# Patient Record
Sex: Female | Born: 1995 | Race: White | Hispanic: No | Marital: Single
Health system: Midwestern US, Community
[De-identification: ages and names within clinical notes are randomized; demographics above are authoritative.]

## PROBLEM LIST (undated history)

## (undated) DIAGNOSIS — N135 Crossing vessel and stricture of ureter without hydronephrosis: Secondary | ICD-10-CM

## (undated) DIAGNOSIS — Q6211 Congenital occlusion of ureteropelvic junction: Secondary | ICD-10-CM

## (undated) DIAGNOSIS — R1031 Right lower quadrant pain: Secondary | ICD-10-CM

## (undated) DIAGNOSIS — N946 Dysmenorrhea, unspecified: Secondary | ICD-10-CM

## (undated) DIAGNOSIS — G8918 Other acute postprocedural pain: Secondary | ICD-10-CM

## (undated) DIAGNOSIS — R109 Unspecified abdominal pain: Secondary | ICD-10-CM

## (undated) DIAGNOSIS — G8929 Other chronic pain: Secondary | ICD-10-CM

## (undated) DIAGNOSIS — R102 Pelvic and perineal pain: Secondary | ICD-10-CM

## (undated) DIAGNOSIS — N926 Irregular menstruation, unspecified: Secondary | ICD-10-CM

## (undated) DIAGNOSIS — M79603 Pain in arm, unspecified: Secondary | ICD-10-CM

---
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
## 2015-01-11 ENCOUNTER — Ambulatory Visit
Admit: 2015-01-11 | Discharge: 2015-01-11 | Payer: PRIVATE HEALTH INSURANCE | Attending: Urology | Primary: Family Medicine

## 2015-01-11 DIAGNOSIS — N135 Crossing vessel and stricture of ureter without hydronephrosis: Secondary | ICD-10-CM

## 2015-01-11 LAB — AMB POC URINALYSIS DIP STICK AUTO W/O MICRO
Bilirubin (UA POC): NEGATIVE
Blood (UA POC): NEGATIVE
Glucose (UA POC): NEGATIVE
Ketones (UA POC): NEGATIVE
Leukocyte esterase (UA POC): NEGATIVE
Nitrites (UA POC): NEGATIVE
Protein (UA POC): NEGATIVE mg/dL
Specific gravity (UA POC): 1.01 (ref 1.001–1.035)
Urobilinogen (UA POC): 0.2 (ref 0.2–1)
pH (UA POC): 7 (ref 4.6–8.0)

## 2015-01-11 NOTE — Progress Notes (Signed)
[name]  06/13/1995      ASSESSMENT:     ICD-10-CM ICD-9-CM    1. Ureteropelvic junction (UPJ) obstruction N13.5 593.4 AMB POC URINALYSIS DIP STICK AUTO W/O MICRO   2. Hydronephrosis of left kidney N13.30 591 AMB POC URINALYSIS DIP STICK AUTO W/O MICRO      1. Left UPJO by CT a/p with contrast 09/2014. F/u renal lasix scan demonstrates marked decrease uptake and increased accumulation of isotope with Lasix in left kidney. Poor excretion. Findings consistent with obstruction. T 1/2 69 min. Split renal function: left 46% and right 54%. Management options discussed, including indications, r/b. Patient desires robotic pyeloplasty as means of definitive repair.     Crossing lower pole vessel.    PLAN:    1. Reviewed CT a/p from renal lasix scan from 09/2014 with the patient and family, outlined below  2. Avoid intake of large fluid volume in 1 sitting   3. Plan for LEFT robotic pyeloplasty with antegrade stent and cysto to confirm stent position. Discussed indications, r/b with the patient. Patient indicated understanding and expressed desire to proceed. All questions answered.     DISCUSSION:  We discussed management options for UPJ obstruction: pyeloplasty vs endopyelotomy.  We discussed r/b of each including minimally invasive nature of endopyelotomy with faster recovery but better long term success rates with pyeloplasty.  The patient desires pyeloplasty with robotic approach.  We discussed r/b including infection, bleeding, blood transfusion, pain, anastamotic leak and or stricture, injury to kidney and/or surrounding organs, and global anesthesia and perioperative risks including CVA, MI, DVT, PE, pneumonia, and death.  The patient understands and desires to proceed.     Chief Complaint   Patient presents with   ??? Flank Pain   ??? UPJ Obstruction     HISTORY OF PRESENT ILLNESS: Kim [name] is a 19 y.o. female who presents today with her mother and father for the evaluation and management of left  UPJO. She has been referred to me by Dr. Janalyn [name]. The patient presented to the emergency department at [facility] on 10/05/14 with complaints of left flank pain. Pain characterized as an 8-9 on a scale of 1-10. No nausea associated. No f/c.  No UTIs.  No hematuria.  Pain presented after exercising. (She is in college at [employer] and plays softball.) She underwent further evaluation with CT at that time and was noted to have  dilatation of the left renal pelvis secondary to ureteropelvic junction obstruction. F/u renal lasix scan shows marked decrease uptake and increased accumulation of isotope with Lasix in left kidney. Poor excretion. Findings consistent with obstruction.     Past Medical History   Diagnosis Date   ??? Reactive airway disease        Past Surgical History   Procedure Laterality Date   ??? Hx tonsil and adenoidectomy         Social History   Substance Use Topics   ??? Smoking status: Never Smoker   ??? Smokeless tobacco: None   ??? Alcohol use No       No Known Allergies    No family history on file.    Current Outpatient Prescriptions   Medication Sig Dispense Refill   ??? naproxen (NAPROSYN) 250 mg tablet Take  by mouth two (2) times daily (with meals).     ??? HYDROCODONE/ACETAMINOPHEN (LORTAB 5-325 PO) Take  by mouth.         Review of Systems  Constitutional: Fever: No  Skin: Rash: No  HEENT: Hearing difficulty: No  Eyes: Blurred vision: No  Cardiovascular: Chest pain: No  Respiratory: Shortness of breath: No  Gastrointestinal: Nausea/vomiting: No  Musculoskeletal: Back pain: No  Neurological: Weakness: No  Psychological: Memory loss: No  Comments/additional findings:       PHYSICAL EXAMINATION:   Visit Vitals   ??? BP 118/72   ??? Ht 5' 10.5" (1.791 m)   ??? Wt 165 lb (74.8 kg)   ??? LMP 12/21/2014   ??? BMI 23.34 kg/m2     GEN: NAD, alert and oriented  PULM: nl resp effort, no distress  EXT: well perfused  PSYCH: Nl mood and affect    REVIEW OF LABS AND IMAGING:     Results for orders placed or performed in visit on 01/11/15   AMB POC URINALYSIS DIP STICK AUTO W/O MICRO   Result Value Ref Range    Color (UA POC) Yellow     Clarity (UA POC) Clear     Glucose (UA POC) Negative Negative    Bilirubin (UA POC) Negative Negative    Ketones (UA POC) Negative Negative    Specific gravity (UA POC) 1.010 1.001 - 1.035    Blood (UA POC) Negative Negative    pH (UA POC) 7.0 4.6 - 8.0    Protein (UA POC) Negative Negative mg/dL    Urobilinogen (UA POC) 0.2 mg/dL 0.2 - 1    Nitrites (UA POC) Negative Negative    Leukocyte esterase (UA POC) Negative Negative     NM RENAL SCAN FLOW/FUNC W PHARM SNGL 10/11/14 - report and imaging reviewed:  FINDINGS: 9 mCi technetium 99 in MAG3 were given intravenously. 20 mg of Lasix  were given intravenously 15 minutes.  ??  Imaging of the kidneys were obtained over 45 minutes.  ??  Aortic renal time: normal less than 3 seconds  left kidney >3 seconds  right kidney 2 seconds   ??  Transit time: Normal less than 5 minutes  Left kidney 26.5 minutes  Right kidney  ??  Excretory phase  T half max: Normal less than 10 minute  Left 96 minutes  Right 14.5 minutes  ??  T half post Lasix: Normal less than 15 minutes  Left 69.25 minutes   Right 3.25 minutes   ??  Percent excretion at 10 minutes: Normal greater than 50%  Left not applicable %  Right 14.6%  ??  Relative renal uptake: Normal 44-56%  Left 46%  Right 54%  ??  IMPRESSION:  1. Right kidney demonstrates mild delayed uptake and moderate decreased  excretion of isotope. Normal washout with Lasix. Findings consistent with  underlying medical renal disease. No obstruction.  2. Left kidney demonstrates marked decrease uptake and increased accumulation of  isotope with Lasix. Poor excretion. Findings consistent with obstruction.    CT Abdomen and Pelvis With Contrast 10/04/14 - report and imaging reviewed:  History: Lower pelvic pain for one week. Hematuria.   IMPRESSION: Chronic left UPJ obstruction. 3.1 cm right ovarian cyst. Free pelvic  fluid.  ??  Comment: CT images of the abdomen and pelvis were obtained following the  administration of oral and intravenous contrast.  ??  The lung bases are clear.  ??  The liver spleen pancreas adrenal glands and gallbladder are unremarkable.  ??  There is marked hydronephrosis and dilatation of the left renal pelvis secondary  to ureteropelvic junction obstruction. Otherwise the kidneys ureters and bladder  are normal.  ??  A 3 cm cyst resides in  the right adnexa. In addition there is free pelvic fluid  compatible with cyst rupture. Small follicular cysts are noted in the left  ovary. The uterus is unremarkable.  ??  No bowel obstruction, free intraperitoneal air or appendicitis.      Ruben Reason, MD   Assistant Professor   [facility] Dept of Urology   [facility] [place] [place]    CC: Benay Spice, MD    Medical Documentation is provided with the assistance of Frederich Cha, medical scribe for Ruben Reason, MD.

## 2015-01-13 ENCOUNTER — Encounter: Attending: Urology | Primary: Family Medicine

## 2015-01-27 ENCOUNTER — Encounter: Attending: Urology | Primary: Family Medicine

## 2015-02-23 ENCOUNTER — Inpatient Hospital Stay: Admit: 2015-02-23 | Payer: TRICARE (CHAMPUS) | Primary: Family Medicine

## 2015-02-23 DIAGNOSIS — Z01818 Encounter for other preprocedural examination: Secondary | ICD-10-CM

## 2015-02-23 LAB — URINALYSIS W/ RFLX MICROSCOPIC
Bilirubin: NEGATIVE
Glucose: NEGATIVE mg/dl
Ketone: NEGATIVE mg/dl
Leukocyte Esterase: NEGATIVE
Nitrites: NEGATIVE
Protein: NEGATIVE mg/dl
Specific gravity: 1.015 (ref 1.005–1.030)
Urobilinogen: 0.2 mg/dl (ref 0.0–1.0)
pH (UA): 6 (ref 5.0–9.0)

## 2015-02-23 LAB — CBC WITH AUTOMATED DIFF
BASOPHILS: 0.4 % (ref 0–3)
EOSINOPHILS: 2.4 % (ref 0–5)
HCT: 42.4 % (ref 37.0–50.0)
HGB: 14.8 gm/dl (ref 13.0–17.2)
IMMATURE GRANULOCYTES: 0.3 % (ref 0.0–3.0)
LYMPHOCYTES: 32.4 % (ref 28–48)
MCH: 29.8 pg (ref 25.4–34.6)
MCHC: 34.9 gm/dl (ref 30.0–36.0)
MCV: 85.5 fL (ref 80.0–98.0)
MONOCYTES: 5.9 % (ref 1–13)
MPV: 9.6 fL (ref 6.0–10.0)
NEUTROPHILS: 58.6 % (ref 34–64)
NRBC: 0 (ref 0–0)
PLATELET: 313 10*3/uL (ref 140–450)
RBC: 4.96 M/uL (ref 3.60–5.20)
RDW-SD: 39.3 (ref 36.4–46.3)
WBC: 7.5 10*3/uL (ref 4.0–11.0)

## 2015-02-23 LAB — POC URINE MICROSCOPIC

## 2015-02-23 LAB — METABOLIC PANEL, BASIC
BUN: 13 mg/dl (ref 7–25)
CO2: 29 mEq/L (ref 21–32)
Calcium: 8.8 mg/dl (ref 8.5–10.1)
Chloride: 105 mEq/L (ref 98–107)
Creatinine: 0.8 mg/dl (ref 0.6–1.3)
GFR est AA: >60
GFR est non-AA: >60
Glucose: 74 mg/dl (ref 74–106)
Potassium: 3.6 mEq/L (ref 3.5–5.1)
Sodium: 141 mEq/L (ref 136–145)

## 2015-02-23 LAB — PROTHROMBIN TIME + INR
INR: 1.1 (ref 0.1–1.1)
Prothrombin time: 13.8 seconds (ref 11.5–14.0)

## 2015-02-23 LAB — HCG URINE, QL: HCG urine, QL: NEGATIVE

## 2015-02-23 NOTE — Progress Notes (Signed)
Noted  Fwd to pt

## 2015-02-24 LAB — CULTURE, URINE
CULTURE RESULT: 10000 — AB
Culture result: 10000 — AB

## 2015-03-05 NOTE — Anesthesia Pre-Procedure Evaluation (Addendum)
Anesthetic History   No history of anesthetic complications            Review of Systems / Medical History  Patient summary reviewed, nursing notes reviewed and pertinent labs reviewed    Pulmonary  Within defined limits                 Neuro/Psych   Within defined limits           Cardiovascular  Within defined limits                     GI/Hepatic/Renal         Renal disease      Comments: upj obstruction Endo/Other  Within defined limits           Other Findings            Physical Exam    Airway  Mallampati: II  TM Distance: 4 - 6 cm  Neck ROM: normal range of motion   Mouth opening: Normal     Cardiovascular  Regular rate and rhythm,  S1 and S2 normal,  no murmur, click, rub, or gallop             Dental  No notable dental hx       Pulmonary  Breath sounds clear to auscultation               Abdominal  GI exam deferred       Other Findings            Anesthetic Plan    ASA: 2  Anesthesia type: general  GA ETT  Flank position  IV tylenol          Induction: Intravenous  Anesthetic plan and risks discussed with: Patient and Family      Discussed the anesthesiology plan and consent form including common and serious adverse events in detail.  QA to patient satisfaction.

## 2015-03-06 ENCOUNTER — Encounter: Primary: Family Medicine

## 2015-03-06 ENCOUNTER — Inpatient Hospital Stay
Admit: 2015-03-06 | Discharge: 2015-03-07 | Disposition: A | Discharge status: Home / Self Care | Payer: TRICARE (CHAMPUS) | Attending: Urology | Admitting: Urology

## 2015-03-06 ENCOUNTER — Ambulatory Visit: Admit: 2015-03-06 | Payer: TRICARE (CHAMPUS) | Primary: Family Medicine

## 2015-03-06 DIAGNOSIS — N135 Crossing vessel and stricture of ureter without hydronephrosis: Secondary | ICD-10-CM

## 2015-03-06 LAB — ANTIBODY SCREEN: Antibody screen: NEGATIVE

## 2015-03-06 LAB — BLOOD TYPE, (ABO+RH)
ABO/Rh(D): AB POS
ABO/Rh: AB POS

## 2015-03-06 LAB — POC HCG,URINE: HCG urine, QL: NEGATIVE

## 2015-03-06 MED ORDER — ACETAMINOPHEN (TYLENOL) SOLUTION 32MG/ML
ORAL | Status: DC | PRN
Start: 2015-03-06 — End: 2015-03-07

## 2015-03-06 MED ORDER — ONDANSETRON (PF) 4 MG/2 ML INJECTION
4 mg/2 mL | Freq: Once | INTRAMUSCULAR | Status: DC | PRN
Start: 2015-03-06 — End: 2015-03-06

## 2015-03-06 MED ORDER — FLU VACCINE QV 2016-17 (36 MOS+)(PF) 60 MCG (15 MCGX4)/0.5 ML IM SYRINGE
60 mcg (15 mcg x 4)/0.5 mL | INTRAMUSCULAR | Status: AC
Start: 2015-03-06 — End: 2015-03-07
  Administered 2015-03-07: 20:00:00 via INTRAMUSCULAR

## 2015-03-06 MED ORDER — ONDANSETRON (PF) 4 MG/2 ML INJECTION
4 mg/2 mL | INTRAMUSCULAR | Status: AC
Start: 2015-03-06 — End: ?

## 2015-03-06 MED ORDER — HYDROMORPHONE (PF) 2 MG/ML IJ SOLN
2 mg/mL | INTRAMUSCULAR | Status: AC
Start: 2015-03-06 — End: ?

## 2015-03-06 MED ORDER — HYDROMORPHONE (PF) 2 MG/ML IJ SOLN
2 mg/mL | INTRAMUSCULAR | Status: DC | PRN
Start: 2015-03-06 — End: 2015-03-06
  Administered 2015-03-06 (×3): via INTRAVENOUS

## 2015-03-06 MED ORDER — BACTERIOSTATIC SALINE 0.9 % IJ SOLN
0.9 % | INTRAMUSCULAR | Status: AC
Start: 2015-03-06 — End: ?

## 2015-03-06 MED ORDER — DIPHENHYDRAMINE HCL 50 MG/ML IJ SOLN
50 mg/mL | Freq: Four times a day (QID) | INTRAMUSCULAR | Status: DC | PRN
Start: 2015-03-06 — End: 2015-03-07

## 2015-03-06 MED ORDER — GUM MASTIC-STORAX-METHYLSALICYLATE-ALCOHOL IN A DROPPERETTE
Status: AC
Start: 2015-03-06 — End: ?

## 2015-03-06 MED ORDER — HYDROMORPHONE (PF) 1 MG/ML IJ SOLN
1 mg/mL | INTRAMUSCULAR | Status: DC | PRN
Start: 2015-03-06 — End: 2015-03-06
  Administered 2015-03-06 (×2): via INTRAVENOUS

## 2015-03-06 MED ORDER — PHENYLEPHRINE 10 MG/ML INJECTION
10 mg/mL | INTRAMUSCULAR | Status: AC
Start: 2015-03-06 — End: ?

## 2015-03-06 MED ORDER — LIDOCAINE 4 % TOPICAL SOLN
4 % (0 mg/mL) | Status: DC | PRN
Start: 2015-03-06 — End: 2015-03-06
  Administered 2015-03-06: 13:00:00 via NASAL

## 2015-03-06 MED ORDER — HYDROCODONE-ACETAMINOPHEN 5 MG-325 MG TAB
5-325 mg | Freq: Four times a day (QID) | ORAL | Status: DC | PRN
Start: 2015-03-06 — End: 2015-03-07
  Administered 2015-03-06 – 2015-03-07 (×2): via ORAL

## 2015-03-06 MED ORDER — THROMBIN (RECOMBINANT) 5,000 UNIT TOPICAL SOLUTION
5000 unit | CUTANEOUS | Status: AC
Start: 2015-03-06 — End: ?

## 2015-03-06 MED ORDER — SODIUM CHLORIDE 0.9 % IJ SYRG
INTRAMUSCULAR | Status: DC | PRN
Start: 2015-03-06 — End: 2015-03-06

## 2015-03-06 MED ORDER — LACTATED RINGERS IV
INTRAVENOUS | Status: DC
Start: 2015-03-06 — End: 2015-03-06
  Administered 2015-03-06: 11:00:00 via INTRAVENOUS

## 2015-03-06 MED ORDER — HEPARIN (PORCINE) 5,000 UNIT/ML IJ SOLN
5000 unit/mL | Freq: Two times a day (BID) | INTRAMUSCULAR | Status: DC
Start: 2015-03-06 — End: 2015-03-07
  Administered 2015-03-06 – 2015-03-07 (×2): via SUBCUTANEOUS

## 2015-03-06 MED ORDER — DOCUSATE SODIUM 100 MG CAP
100 mg | ORAL_CAPSULE | Freq: Three times a day (TID) | ORAL | 0 refills | Status: DC | PRN
Start: 2015-03-06 — End: 2015-03-31

## 2015-03-06 MED ORDER — KETOROLAC TROMETHAMINE 30 MG/ML INJECTION
30 mg/mL (1 mL) | INTRAMUSCULAR | Status: AC
Start: 2015-03-06 — End: ?

## 2015-03-06 MED ORDER — LIDOCAINE HCL 1 % (10 MG/ML) IJ SOLN
10 mg/mL (1 %) | Freq: Once | INTRAMUSCULAR | Status: AC | PRN
Start: 2015-03-06 — End: 2015-03-06
  Administered 2015-03-06: 11:00:00 via INTRADERMAL

## 2015-03-06 MED ORDER — ACETAMINOPHEN 325 MG TABLET
325 mg | ORAL | Status: DC | PRN
Start: 2015-03-06 — End: 2015-03-07

## 2015-03-06 MED ORDER — SODIUM CHLORIDE 0.9 % IV
INTRAVENOUS | Status: DC
Start: 2015-03-06 — End: 2015-03-07
  Administered 2015-03-06 – 2015-03-07 (×2): via INTRAVENOUS

## 2015-03-06 MED ORDER — CEFAZOLIN 2 GM/50 ML IN DEXTROSE (ISO-OSMOTIC) IVPB
2 gram/50 mL | Freq: Once | INTRAVENOUS | Status: AC
Start: 2015-03-06 — End: 2015-03-06
  Administered 2015-03-06: 13:00:00 via INTRAVENOUS

## 2015-03-06 MED ORDER — BUPIVACAINE (PF) 0.25 % (2.5 MG/ML) IJ SOLN
0.25 % (2.5 mg/mL) | INTRAMUSCULAR | Status: DC | PRN
Start: 2015-03-06 — End: 2015-03-06
  Administered 2015-03-06: 15:00:00 via SUBCUTANEOUS

## 2015-03-06 MED ORDER — OXYBUTYNIN CHLORIDE 5 MG TAB
5 mg | Freq: Four times a day (QID) | ORAL | Status: DC | PRN
Start: 2015-03-06 — End: 2015-03-07
  Administered 2015-03-07: 01:00:00 via ORAL

## 2015-03-06 MED ORDER — ONDANSETRON (PF) 4 MG/2 ML INJECTION
4 mg/2 mL | INTRAMUSCULAR | Status: DC | PRN
Start: 2015-03-06 — End: 2015-03-07

## 2015-03-06 MED ORDER — MIDAZOLAM 1 MG/ML IJ SOLN
1 mg/mL | INTRAMUSCULAR | Status: AC
Start: 2015-03-06 — End: ?

## 2015-03-06 MED ORDER — HEPARIN (PORCINE) 5,000 UNIT/ML IJ SOLN
5000 unit/mL | INTRAMUSCULAR | Status: AC
Start: 2015-03-06 — End: 2015-03-06
  Administered 2015-03-06: 12:00:00 via SUBCUTANEOUS

## 2015-03-06 MED ORDER — ROCURONIUM 10 MG/ML IV
10 mg/mL | INTRAVENOUS | Status: DC | PRN
Start: 2015-03-06 — End: 2015-03-06
  Administered 2015-03-06 (×2): via INTRAVENOUS

## 2015-03-06 MED ORDER — KETOROLAC TROMETHAMINE 30 MG/ML INJECTION
30 mg/mL (1 mL) | Freq: Four times a day (QID) | INTRAMUSCULAR | Status: DC
Start: 2015-03-06 — End: 2015-03-06
  Administered 2015-03-06: 20:00:00 via INTRAVENOUS

## 2015-03-06 MED ORDER — LACTATED RINGERS IV
INTRAVENOUS | Status: DC | PRN
Start: 2015-03-06 — End: 2015-03-06
  Administered 2015-03-06: 13:00:00 via INTRAVENOUS

## 2015-03-06 MED ORDER — LIDOCAINE HCL 1 % (10 MG/ML) IJ SOLN
10 mg/mL (1 %) | INTRAMUSCULAR | Status: AC
Start: 2015-03-06 — End: ?

## 2015-03-06 MED ORDER — VECURONIUM BROMIDE 10 MG IV SOLR
10 mg | INTRAVENOUS | Status: AC
Start: 2015-03-06 — End: ?

## 2015-03-06 MED ORDER — PROMETHAZINE IN NS 6.25 MG/50 ML IV PIGGY BAG
6.25 mg/50 ml | INTRAVENOUS | Status: DC | PRN
Start: 2015-03-06 — End: 2015-03-06

## 2015-03-06 MED ORDER — METHYLENE BLUE (ANTIDOTE) 1 % IJ SOLN
1 % (0 mg/mL) | INTRAVENOUS | Status: AC
Start: 2015-03-06 — End: ?

## 2015-03-06 MED ORDER — FENTANYL CITRATE (PF) 50 MCG/ML IJ SOLN
50 mcg/mL | INTRAMUSCULAR | Status: AC
Start: 2015-03-06 — End: ?

## 2015-03-06 MED ORDER — WATER FOR INJECTION, STERILE INJECTION
INTRAMUSCULAR | Status: AC
Start: 2015-03-06 — End: ?

## 2015-03-06 MED ORDER — GLYCOPYRROLATE 0.2 MG/ML IJ SOLN
0.2 mg/mL | INTRAMUSCULAR | Status: DC | PRN
Start: 2015-03-06 — End: 2015-03-06
  Administered 2015-03-06 (×3): via INTRAVENOUS

## 2015-03-06 MED ORDER — HYDROCODONE-ACETAMINOPHEN 5 MG-325 MG TAB
5-325 mg | ORAL_TABLET | ORAL | 0 refills | Status: DC | PRN
Start: 2015-03-06 — End: 2015-03-31

## 2015-03-06 MED ORDER — PROPOFOL 10 MG/ML IV EMUL
10 mg/mL | INTRAVENOUS | Status: AC
Start: 2015-03-06 — End: ?

## 2015-03-06 MED ORDER — HEPARIN (PORCINE) 1,000 UNIT/ML IJ SOLN
1000 unit/mL | INTRAMUSCULAR | Status: AC
Start: 2015-03-06 — End: ?

## 2015-03-06 MED ORDER — CEFAZOLIN 2 GM/50 ML IN DEXTROSE (ISO-OSMOTIC) IVPB
2 gram/50 mL | Freq: Three times a day (TID) | INTRAVENOUS | Status: DC
Start: 2015-03-06 — End: 2015-03-06
  Administered 2015-03-06: 16:00:00 via INTRAVENOUS

## 2015-03-06 MED ORDER — GLYCOPYRROLATE 0.2 MG/ML IJ SOLN
0.2 mg/mL | INTRAMUSCULAR | Status: AC
Start: 2015-03-06 — End: ?

## 2015-03-06 MED ORDER — DEXAMETHASONE SODIUM PHOSPHATE 4 MG/ML IJ SOLN
4 mg/mL | INTRAMUSCULAR | Status: AC
Start: 2015-03-06 — End: ?

## 2015-03-06 MED ORDER — NALOXONE 0.4 MG/ML INJECTION
0.4 mg/mL | INTRAMUSCULAR | Status: DC | PRN
Start: 2015-03-06 — End: 2015-03-07

## 2015-03-06 MED ORDER — CEFAZOLIN 2 GM/50 ML IN DEXTROSE (ISO-OSMOTIC) IVPB
2 gram/50 mL | INTRAVENOUS | Status: DC
Start: 2015-03-06 — End: 2015-03-06

## 2015-03-06 MED ORDER — BUPIVACAINE-EPINEPHRINE (PF) 0.25 %-1:200,000 IJ SOLN
0.25 %-1:200,000 | INTRAMUSCULAR | Status: AC
Start: 2015-03-06 — End: ?

## 2015-03-06 MED ORDER — NEOSTIGMINE METHYLSULFATE 1 MG/ML INJECTION
1 mg/mL | INTRAMUSCULAR | Status: AC
Start: 2015-03-06 — End: ?

## 2015-03-06 MED ORDER — ACETAMINOPHEN 650 MG RECTAL SUPPOSITORY
650 mg | RECTAL | Status: DC | PRN
Start: 2015-03-06 — End: 2015-03-07

## 2015-03-06 MED ORDER — ACETAMINOPHEN 1,000 MG/100 ML (10 MG/ML) IV
1000 mg/100 mL (10 mg/mL) | INTRAVENOUS | Status: AC
Start: 2015-03-06 — End: ?

## 2015-03-06 MED ORDER — ONDANSETRON (PF) 4 MG/2 ML INJECTION
4 mg/2 mL | INTRAMUSCULAR | Status: DC | PRN
Start: 2015-03-06 — End: 2015-03-06
  Administered 2015-03-06: 12:00:00 via INTRAVENOUS

## 2015-03-06 MED ORDER — DEXAMETHASONE SODIUM PHOSPHATE 4 MG/ML IJ SOLN
4 mg/mL | INTRAMUSCULAR | Status: DC | PRN
Start: 2015-03-06 — End: 2015-03-06
  Administered 2015-03-06: 13:00:00 via INTRAVENOUS

## 2015-03-06 MED ORDER — SUCCINYLCHOLINE CHLORIDE 20 MG/ML INJECTION
20 mg/mL | INTRAMUSCULAR | Status: AC
Start: 2015-03-06 — End: ?

## 2015-03-06 MED ORDER — MORPHINE 2 MG/ML INJECTION
2 mg/mL | INTRAMUSCULAR | Status: DC | PRN
Start: 2015-03-06 — End: 2015-03-07
  Administered 2015-03-06 – 2015-03-07 (×8): via INTRAVENOUS

## 2015-03-06 MED ORDER — DOCUSATE SODIUM 100 MG CAP
100 mg | Freq: Two times a day (BID) | ORAL | Status: DC
Start: 2015-03-06 — End: 2015-03-07
  Administered 2015-03-07 (×2): via ORAL

## 2015-03-06 MED ORDER — KETOROLAC TROMETHAMINE 30 MG/ML INJECTION
30 mg/mL (1 mL) | INTRAMUSCULAR | Status: DC | PRN
Start: 2015-03-06 — End: 2015-03-06
  Administered 2015-03-06: 16:00:00 via INTRAVENOUS

## 2015-03-06 MED ORDER — OXYBUTYNIN CHLORIDE SR 5 MG 24 HR TAB
5 mg | ORAL_TABLET | Freq: Every day | ORAL | 0 refills | Status: DC
Start: 2015-03-06 — End: 2015-05-26

## 2015-03-06 MED ORDER — MIDAZOLAM 1 MG/ML IJ SOLN
1 mg/mL | INTRAMUSCULAR | Status: DC | PRN
Start: 2015-03-06 — End: 2015-03-06
  Administered 2015-03-06: 13:00:00 via INTRAVENOUS

## 2015-03-06 MED ORDER — CEFAZOLIN 2 GM/50 ML IN DEXTROSE (ISO-OSMOTIC) IVPB
2 gram/50 mL | Freq: Three times a day (TID) | INTRAVENOUS | Status: AC
Start: 2015-03-06 — End: 2015-03-07
  Administered 2015-03-06 – 2015-03-07 (×2): via INTRAVENOUS

## 2015-03-06 MED ORDER — LIDOCAINE (PF) 20 MG/ML (2 %) IJ SOLN
20 mg/mL (2 %) | INTRAMUSCULAR | Status: DC | PRN
Start: 2015-03-06 — End: 2015-03-06
  Administered 2015-03-06: 13:00:00 via INTRAVENOUS

## 2015-03-06 MED ORDER — EPHEDRINE SULFATE 50 MG/ML IJ SOLN
50 mg/mL | INTRAMUSCULAR | Status: AC
Start: 2015-03-06 — End: ?

## 2015-03-06 MED ORDER — THROMBIN (RECOMBINANT) 5,000 UNIT TOPICAL SOLUTION
5000 unit | CUTANEOUS | Status: DC | PRN
Start: 2015-03-06 — End: 2015-03-06
  Administered 2015-03-06: 15:00:00 via TOPICAL

## 2015-03-06 MED ORDER — BUPIVACAINE LIPOSOME (PF) 266 MG/20 ML (13.3 MG/ML) SUSP, INFILTRATION
1.3 % (13.3 mg/mL) | Status: AC
Start: 2015-03-06 — End: ?

## 2015-03-06 MED ORDER — BUPIVACAINE (PF) 0.25 % (2.5 MG/ML) IJ SOLN
0.25 % (2.5 mg/mL) | INTRAMUSCULAR | Status: AC
Start: 2015-03-06 — End: ?

## 2015-03-06 MED ORDER — VECURONIUM BROMIDE 10 MG IV SOLR
10 mg | INTRAVENOUS | Status: DC | PRN
Start: 2015-03-06 — End: 2015-03-06
  Administered 2015-03-06 (×4): via INTRAVENOUS

## 2015-03-06 MED ORDER — PHENOL 1.4 % MUCOSAL AEROSOL SPRAY
1.4 % | Status: DC | PRN
Start: 2015-03-06 — End: 2015-03-07

## 2015-03-06 MED ORDER — FENTANYL CITRATE (PF) 50 MCG/ML IJ SOLN
50 mcg/mL | INTRAMUSCULAR | Status: DC | PRN
Start: 2015-03-06 — End: 2015-03-06
  Administered 2015-03-06 (×6): via INTRAVENOUS

## 2015-03-06 MED ORDER — ROCURONIUM 10 MG/ML IV
10 mg/mL | INTRAVENOUS | Status: AC
Start: 2015-03-06 — End: ?

## 2015-03-06 MED ORDER — FENTANYL CITRATE (PF) 50 MCG/ML IJ SOLN
50 mcg/mL | INTRAMUSCULAR | Status: DC | PRN
Start: 2015-03-06 — End: 2015-03-06
  Administered 2015-03-06 (×2): via INTRAVENOUS

## 2015-03-06 MED ORDER — HEPARIN (PORCINE) 5,000 UNIT/ML IJ SOLN
5000 unit/mL | INTRAMUSCULAR | Status: DC
Start: 2015-03-06 — End: 2015-03-06

## 2015-03-06 MED ORDER — PROPOFOL 10 MG/ML IV EMUL
10 mg/mL | INTRAVENOUS | Status: DC | PRN
Start: 2015-03-06 — End: 2015-03-06
  Administered 2015-03-06: 13:00:00 via INTRAVENOUS

## 2015-03-06 MED ORDER — ACETAMINOPHEN 1,000 MG/100 ML (10 MG/ML) IV
1000 mg/100 mL (10 mg/mL) | INTRAVENOUS | Status: DC | PRN
Start: 2015-03-06 — End: 2015-03-06
  Administered 2015-03-06: 13:00:00 via INTRAVENOUS

## 2015-03-06 MED ORDER — NEOSTIGMINE METHYLSULFATE 1 MG/ML INJECTION
1 mg/mL | INTRAMUSCULAR | Status: DC | PRN
Start: 2015-03-06 — End: 2015-03-06
  Administered 2015-03-06 (×2): via INTRAVENOUS

## 2015-03-06 MED FILL — MARCAINE-EPINEPHRINE (PF) 0.25 %-1:200,000 INJECTION SOLUTION: 0.25 %-1:200,000 | INTRAMUSCULAR | Qty: 30

## 2015-03-06 MED FILL — LACTATED RINGERS IV: INTRAVENOUS | Qty: 1000

## 2015-03-06 MED FILL — FENTANYL CITRATE (PF) 50 MCG/ML IJ SOLN: 50 mcg/mL | INTRAMUSCULAR | Qty: 200

## 2015-03-06 MED FILL — MORPHINE 2 MG/ML INJECTION: 2 mg/mL | INTRAMUSCULAR | Qty: 1

## 2015-03-06 MED FILL — ONDANSETRON (PF) 4 MG/2 ML INJECTION: 4 mg/2 mL | INTRAMUSCULAR | Qty: 2

## 2015-03-06 MED FILL — HEPARIN (PORCINE) 5,000 UNIT/ML IJ SOLN: 5000 unit/mL | INTRAMUSCULAR | Qty: 1

## 2015-03-06 MED FILL — LIDOCAINE HCL 1 % (10 MG/ML) IJ SOLN: 10 mg/mL (1 %) | INTRAMUSCULAR | Qty: 20

## 2015-03-06 MED FILL — KETOROLAC TROMETHAMINE 30 MG/ML INJECTION: 30 mg/mL (1 mL) | INTRAMUSCULAR | Qty: 1

## 2015-03-06 MED FILL — METHYLENE BLUE (ANTIDOTE) 1 % IJ SOLN: 1 % (0 mg/mL) | INTRAVENOUS | Qty: 10

## 2015-03-06 MED FILL — EXPAREL (PF) 1.3 % (13.3 MG/ML) SUSPENSION FOR LOCAL INFILTRATION: 1.3 % (13.3 mg/mL) | Qty: 20

## 2015-03-06 MED FILL — ROCURONIUM 10 MG/ML IV: 10 mg/mL | INTRAVENOUS | Qty: 5

## 2015-03-06 MED FILL — VECURONIUM BROMIDE 10 MG IV SOLR: 10 mg | INTRAVENOUS | Qty: 5

## 2015-03-06 MED FILL — WATER FOR INJECTION, STERILE INJECTION: INTRAMUSCULAR | Qty: 10

## 2015-03-06 MED FILL — LIDOCAINE 4 % TOPICAL SOLN: 4 % (0 mg/mL) | Qty: 4

## 2015-03-06 MED FILL — XYLOCAINE-MPF 20 MG/ML (2 %) INJECTION SOLUTION: 20 mg/mL (2 %) | INTRAMUSCULAR | Qty: 60

## 2015-03-06 MED FILL — RECOTHROM 5,000 UNIT TOPICAL SOLUTION: 5000 unit | CUTANEOUS | Qty: 2

## 2015-03-06 MED FILL — HYDROMORPHONE (PF) 2 MG/ML IJ SOLN: 2 mg/mL | INTRAMUSCULAR | Qty: 1.6

## 2015-03-06 MED FILL — HYDROMORPHONE (PF) 2 MG/ML IJ SOLN: 2 mg/mL | INTRAMUSCULAR | Qty: 1

## 2015-03-06 MED FILL — DEXAMETHASONE SODIUM PHOSPHATE 4 MG/ML IJ SOLN: 4 mg/mL | INTRAMUSCULAR | Qty: 10

## 2015-03-06 MED FILL — HEPARIN (PORCINE) 1,000 UNIT/ML IJ SOLN: 1000 unit/mL | INTRAMUSCULAR | Qty: 10

## 2015-03-06 MED FILL — DIPRIVAN 10 MG/ML INTRAVENOUS EMULSION: 10 mg/mL | INTRAVENOUS | Qty: 20

## 2015-03-06 MED FILL — ROCURONIUM 10 MG/ML IV: 10 mg/mL | INTRAVENOUS | Qty: 50

## 2015-03-06 MED FILL — QUELICIN 20 MG/ML INJECTION SOLUTION: 20 mg/mL | INTRAMUSCULAR | Qty: 10

## 2015-03-06 MED FILL — NEOSTIGMINE METHYLSULFATE 1 MG/ML INJECTION: 1 mg/mL | INTRAMUSCULAR | Qty: 10

## 2015-03-06 MED FILL — CEFAZOLIN 2 GM/50 ML IN DEXTROSE (ISO-OSMOTIC) IVPB: 2 gram/50 mL | INTRAVENOUS | Qty: 50

## 2015-03-06 MED FILL — MIDAZOLAM 1 MG/ML IJ SOLN: 1 mg/mL | INTRAMUSCULAR | Qty: 2

## 2015-03-06 MED FILL — SODIUM CHLORIDE 0.9 % IV: INTRAVENOUS | Qty: 1000

## 2015-03-06 MED FILL — PHENYLEPHRINE 10 MG/ML INJECTION: 10 mg/mL | INTRAMUSCULAR | Qty: 1

## 2015-03-06 MED FILL — ACEPHEN 650 MG RECTAL SUPPOSITORY: 650 mg | RECTAL | Qty: 1

## 2015-03-06 MED FILL — HYDROCODONE-ACETAMINOPHEN 5 MG-325 MG TAB: 5-325 mg | ORAL | Qty: 1

## 2015-03-06 MED FILL — BUPIVACAINE (PF) 0.25 % (2.5 MG/ML) IJ SOLN: 0.25 % (2.5 mg/mL) | INTRAMUSCULAR | Qty: 30

## 2015-03-06 MED FILL — GLYCOPYRROLATE 0.2 MG/ML IJ SOLN: 0.2 mg/mL | INTRAMUSCULAR | Qty: 3

## 2015-03-06 MED FILL — BACTERIOSTATIC SALINE 0.9 % IJ SOLN: 0.9 % | INTRAMUSCULAR | Qty: 30

## 2015-03-06 MED FILL — VECURONIUM BROMIDE 10 MG IV SOLR: 10 mg | INTRAVENOUS | Qty: 10

## 2015-03-06 MED FILL — NEOSTIGMINE METHYLSULFATE 1 MG/ML INJECTION: 1 mg/mL | INTRAMUSCULAR | Qty: 4

## 2015-03-06 MED FILL — DEXAMETHASONE SODIUM PHOSPHATE 4 MG/ML IJ SOLN: 4 mg/mL | INTRAMUSCULAR | Qty: 5

## 2015-03-06 MED FILL — EPHEDRINE SULFATE 50 MG/ML IJ SOLN: 50 mg/mL | INTRAMUSCULAR | Qty: 1

## 2015-03-06 MED FILL — OFIRMEV 1,000 MG/100 ML (10 MG/ML) INTRAVENOUS SOLUTION: 1000 mg/100 mL (10 mg/mL) | INTRAVENOUS | Qty: 100

## 2015-03-06 MED FILL — FENTANYL CITRATE (PF) 50 MCG/ML IJ SOLN: 50 mcg/mL | INTRAMUSCULAR | Qty: 2

## 2015-03-06 MED FILL — GLYCOPYRROLATE 0.2 MG/ML IJ SOLN: 0.2 mg/mL | INTRAMUSCULAR | Qty: 0.9

## 2015-03-06 MED FILL — DIPRIVAN 10 MG/ML INTRAVENOUS EMULSION: 10 mg/mL | INTRAVENOUS | Qty: 200

## 2015-03-06 MED FILL — GUM MASTIC-STORAX-METHYLSALICYLATE-ALCOHOL IN A DROPPERETTE: Qty: 1

## 2015-03-06 MED FILL — CHLORASEPTIC THROAT SPRAY 1.4 % AEROSOL: 1.4 % | Qty: 20

## 2015-03-06 MED FILL — HYDROMORPHONE (PF) 1 MG/ML IJ SOLN: 1 mg/mL | INTRAMUSCULAR | Qty: 1

## 2015-03-06 MED FILL — GLYCOPYRROLATE 0.2 MG/ML IJ SOLN: 0.2 mg/mL | INTRAMUSCULAR | Qty: 1

## 2015-03-06 NOTE — Op Note (Signed)
[facility]  Inpatient Operation Report  NAME:  Kim [name], Kim [name]  SEX:   F  DATE: 03/06/2015  DOB: [birth year]  MR#    [id number]  ROOM:  4207  ACCT#  [id number]        PREOPERATIVE DIAGNOSIS:  Left ureteropelvic junction obstruction.    POSTOPERATIVE DIAGNOSIS:  Left ureteropelvic junction obstruction.    PROCEDURE:  Robotic-assisted laparoscopic left dismembered pyeloplasty with antegrade   stent placement and cystoscopy.    SURGEON:  Dr. Judie Petit    ASSISTANT:  Dr. Bonney Roussel (Fellow)    RESIDENT:  Dr. Einar Grad.    SURGICAL ASSISTANT:   R. Keeling.      ANESTHESIA:  General.    FINDINGS:  Left ureteropelvic junction obstruction with anterior crossing vessel;   dismembered pyeloplasty was performed with transposition of the lower pole   vessel, stent was placed.  Cystoscopy confirmed good stent position.    SPECIMENS:  Left ureteropelvic junction.    BLOOD LOSS:  Less  than 25 mL    DRAINS:  Left 6-French x 26 cm double-J stent and 18-French Foley catheter also   15-French round JP.    COMPLICATIONS:  None.    HISTORY:  A 19 year old female who presents for pyeloplasty for left ureteropelvic   junction obstruction.    DESCRIPTION OF PROCEDURE:  Informed consent was obtained.  The patient was marked on the left side.  She   was given subcutaneous heparin for deep vein thrombosis prophylaxis and IV   Ancef for bacterial prophylaxis.  Her urine pregnancy test was confirmed   negative.  She was taken to the operating room.  General anesthesia was   provided.  A Foley catheter was placed to drain the bladder.  She was   positioned right lateral decubitus with the left flank elevated about 70   degrees, an axillary roll was placed.  Left arm was placed in the padded   support and all pressure points were meticulously padded.  The table was   flexed slightly and she was secured to the table with soft straps of the torso   and lower extremities.  She was prepped and draped sterilely.  We had a   time-out  confirming the patient identification, planned procedure, surgical   site and all present were in agreement.  A Veress needle was placed lateral to   the left rectus belly at the level of the umbilicus.  Aspiration, irrigation   and saline drop test confirmed good positioning.  Pneumoperitoneum was   initiated with an opening pressure of 7 mmHg.  The abdomen was insufflated to   15 mmHg.  Marked four trocar sites which included 2 Robotic arms 1.5 cm apart   in the mid clavicular line, a 12 mm trocar between for the camera and the   midline and another 12 mm trocar in the umbilicus for the assistant.  The 5 mm   trocar was placed at one of the robotic trocar sites using a visual   obturator.  The abdomen was surveyed.  There were no abnormalities or   injuries, other than dilated renal pelvis which was easily apparent.  All the   remaining trocars were placed under laparoscopic vision.  At the two 12 mm   trocar sites, placed a 0 Vicryl with Carter-Thomason device for closure of the   fascia at the end of the case.  Once all trocars were in position, the robot   was docked.  The white line  of Toldt was incised and the left colon was   reflected, we could see a very dilated renal pelvis.  The gonadal vein was   identified and medialized, I then identified the ureter and traced it up to   the UPJ, taking care not to skeletonize the ureter.  I cleaned off the   anterior surface of the ureter and renal pelvis at the UPJ and I noted lower   pole crossing vessels anterior to the UPJ severe hydronephrosis was noted.    The lower pole vessels were mobilized off of the UPJ and the UPJ was   circumferentially exposed.  I divided the ureter just below the UPJ and then   spatulated the proximal aspect of the ureter for about 1.5 cm.  The UPJ was   then excised and sent for pathology.  The renal pelvis was nicely decompressed   at this point.  The apex of the spatulated ureter was secured to the   dependent portion of the renal  pelvis with a couple of interrupted 4-0   Monocryl sutures in a tension free fashion.  This of course was done  anterior   to the lower pole crossing vessels.  The posterior anastomotic plate was   closed with a running 4-0 Monocryl suture.  Next a  Glidewire was advanced down the ureter and a 6 x 26 double-J stent was   advanced over the wire.  The wire was removed and the stent position looked   good.  The proximal curl was tucked into the renal pelvis.  The renal pelvis   was copiously irrigated and there was good hemostasis in the renal pelvis.    The anterior anastomosis was completed with another running 4-0 Monocryl   suture and the 2 suture lines were then tied down securely at the apex.  The   anastomosis was inspected circumferentially and it was watertight, widely   spatulated and tension free.  At this point, we reduced the pneumoperitoneal   pressure down to 5 mmHg and hemostasis was confirmed throughout the surgical   field.  We put some FloSeal around the UPJ.  All the needles and sutures were   removed and our sponge, needle and instrument count was correct at this time.    A 15 French round JP was positioned over the kidney.  The robot was then   undocked.  The trocars were removed.  The drain was secured to the skin with a   nylon suture.  The 0 Vicryls were tied down securely to close the fascia at   the midline 12 mm trocar sites.  All the wounds were irrigated and then   infiltrated with 0.25% Marcaine plain.  The skin edges were reapproximated   with 4-0 Monocryl in subcuticular fashion.  Dermabond was applied.  Drain   dressing was applied.  The patient was then positioned frog legged and the   Foley catheter was removed.  The perineum was prepped and a flexible   cystoscope was introduced into the bladder.  We confirmed that the distal   stent curl was in good position.  The bladder otherwise appeared normal.  The   scope was removed.  An 18-French Foley catheter was placed to drain the    bladder.  The patient was then transferred to stretcher.  She was awakened   from anesthesia and transferred to recovery in stable condition.  There were   no complications and the patient tolerated the procedure well.  Operative   events were discussed with the patient's family members.    DISPOSITION:  She will be admitted for postoperative care.  We will do a void trial tomorrow   morning.  A JP creatinine a few hours later and hopefully get the JP out   tomorrow and discharged home in the afternoon.  She will follow up with me in   the office in 3 to 4 weeks for cystoscopy and stent removal in the office.      ___________________  Ruben ReasonJohn B Hibba Schram M.D.  Dictated By:.   FS  D:03/06/2015 11:06:12  T: 03/06/2015 12:37:43  [id number]

## 2015-03-06 NOTE — Other (Signed)
Family at bed side.

## 2015-03-06 NOTE — Brief Op Note (Signed)
BRIEF OPERATIVE NOTE    Date of Procedure: 03/06/2015   Preoperative Diagnosis: URETEROPLEVIC JUNCTION OBSTRUCTION; (N13.5)  Postoperative Diagnosis: URETEROPLEVIC JUNCTION OBSTRUCTION; (N13.5)    Procedure(s):  DAVINCI SI LEFT PYELOPLASTY; ANTEGRADE STENT & CYSTOSCOPY  Surgeon(s) and Role:     * Ruben ReasonJohn B Nery Frappier, MD - Primary            Surgical Staff:  Circ-1: Maryjo Rochesteramita R Owens, RN  Circ-Relief: London SheerWinifreda I Diaz, RN  Scrub Tech-1: Hope Jamse MeadMelissa Bradley  Surg Asst-1: Cherie DarkRojas R Keeling  Event Time In   Incision Start 0815   Incision Close      Anesthesia: General   Estimated Blood Loss: <25cc  Specimens:   ID Type Source Tests Collected by Time Destination   1 : LEFT URETEROPELVIC JUNCTION Tissue Ureter AP HISTOLOGY Ruben ReasonJohn B Leandrew Keech, MD 03/06/2015 [phone]       Findings: L UPJO with crossing vessel.  Cysto confirmed good stent position.  Complications: none  Implants:   Implant Name Type Inv. Item Serial No. Manufacturer Lot No. LRB No. Used Action   STENT URETERAL SOFT [id number] - [id number]   STENT URETERAL SOFT [id number]   [employer] VPI [id number] Left 1 Implanted       Ruben ReasonJohn B. Emiline Mancebo, MD  Assistant Professor  [facility]  [facility] [place]

## 2015-03-06 NOTE — Other (Signed)
----------  DocumentID:   [id number]------------------------------------------------              [facility]                       Patient Education Report         Name: Christianne BorrowOSSO, Shawonda                  Date: 03/06/2015    MRN: [id number]                    Time: 12:05:59 PM         Patient ordered video: Patient Safety: Stay Safe While you are in the   Hospital    from [id number] via phone number: [phone] at 12:05:59 PM

## 2015-03-06 NOTE — Other (Signed)
Bedside and Verbal shift change report given to Julieanne CottonJosephine C Lel Rn    (oncoming nurse) by Threasa BeardsJanel- Irish Rn (offgoing nurse). Report included the following information SBAR, Intake/Output and MAR.

## 2015-03-06 NOTE — Progress Notes (Signed)
Discussed patient low urine output with Dr. Judie PetitMalcolm. Orders received

## 2015-03-06 NOTE — H&P (Signed)
Kim [name]  1995-04-19  ??  ??  ASSESSMENT:   ?? ?? ICD-10-CM ICD-9-CM ??   1. Ureteropelvic junction (UPJ) obstruction N13.5 593.4 AMB POC URINALYSIS DIP STICK AUTO W/O MICRO   2. Hydronephrosis of left kidney N13.30 591 AMB POC URINALYSIS DIP STICK AUTO W/O MICRO      1. Left UPJO by CT a/p with contrast 09/2014. F/u renal lasix scan demonstrates marked decrease uptake and increased accumulation of isotope with Lasix in left kidney. Poor excretion. Findings consistent with obstruction. T 1/2 69 min. Split renal function: left 46% and right 54%. Management options discussed, including indications, r/b. Patient desires robotic pyeloplasty as means of definitive repair.   ??  Crossing lower pole vessel.  ??  PLAN:    1. Reviewed CT a/p from renal lasix scan from 09/2014 with the patient and family, outlined below  2. Avoid intake of large fluid volume in 1 sitting   3. Plan for LEFT robotic pyeloplasty with antegrade stent and cysto to confirm stent position. Discussed indications, r/b with the patient. Patient indicated understanding and expressed desire to proceed. All questions answered.   ??  DISCUSSION:  We discussed management options for UPJ obstruction: pyeloplasty vs endopyelotomy. We discussed r/b of each including minimally invasive nature of endopyelotomy with faster recovery but better long term success rates with pyeloplasty. The patient desires pyeloplasty with robotic approach. We discussed r/b including infection, bleeding, blood transfusion, pain, anastamotic leak and or stricture, injury to kidney and/or surrounding organs, and global anesthesia and perioperative risks including CVA, MI, DVT, PE, pneumonia, and death. The patient understands and desires to proceed.   ??      Chief Complaint   Patient presents with   ??? Flank Pain   ??? UPJ Obstruction   ??  HISTORY OF PRESENT ILLNESS: Kim [name] is a 19 y.o. female who presents today with her mother and father for the evaluation and management of left  UPJO. She has been referred to me by Dr. Janalyn [name]. The patient presented to the emergency department at [facility] on 10/05/14 with complaints of left flank pain. Pain characterized as an 8-9 on a scale of 1-10. No nausea associated. No f/c. No UTIs. No hematuria. Pain presented after exercising. (She is in college at [employer] and plays softball.) She underwent further evaluation with CT at that time and was noted to have  dilatation of the left renal pelvis secondary to ureteropelvic junction obstruction. F/u renal lasix scan shows marked decrease uptake and increased accumulation of isotope with Lasix in left kidney. Poor excretion. Findings consistent with obstruction.   ??       Past Medical History   Diagnosis Date   ??? Reactive airway disease ??   ??  ??        Past Surgical History   Procedure Laterality Date   ??? Hx tonsil and adenoidectomy ?? ??   ??  ??       Social History   Substance Use Topics   ??? Smoking status: Never Smoker   ??? Smokeless tobacco: None   ??? Alcohol use No   ??  ??  No Known Allergies  ??  No family history on file.  ??         Current Outpatient Prescriptions   Medication Sig Dispense Refill   ??? naproxen (NAPROSYN) 250 mg tablet Take by mouth two (2) times daily (with meals). ?? ??   ??? HYDROCODONE/ACETAMINOPHEN (LORTAB 5-325 PO) Take by mouth. ?? ??   ??  ??  Review of Systems  Constitutional: Fever: No  Skin: Rash: No  HEENT: Hearing difficulty: No  Eyes: Blurred vision: No  Cardiovascular: Chest pain: No  Respiratory: Shortness of breath: No  Gastrointestinal: Nausea/vomiting: No  Musculoskeletal: Back pain: No  Neurological: Weakness: No  Psychological: Memory loss: No  Comments/additional findings:   ??  ??  PHYSICAL EXAMINATION:        Visit Vitals   ??? BP 118/72   ??? Ht 5' 10.5" (1.791 m)   ??? Wt 165 lb (74.8 kg)   ??? LMP 12/21/2014   ??? BMI 23.34 kg/m2   ??  GEN: NAD, alert and oriented  PULM: nl resp effort, no distress  EXT: well perfused  PSYCH: Nl mood and affect  ??  REVIEW OF LABS AND IMAGING:          Results for orders placed or performed in visit on 01/11/15   AMB POC URINALYSIS DIP STICK AUTO W/O MICRO   Result Value Ref Range   ?? Color (UA POC) Yellow ??   ?? Clarity (UA POC) Clear ??   ?? Glucose (UA POC) Negative Negative   ?? Bilirubin (UA POC) Negative Negative   ?? Ketones (UA POC) Negative Negative   ?? Specific gravity (UA POC) 1.010 1.001 - 1.035   ?? Blood (UA POC) Negative Negative   ?? pH (UA POC) 7.0 4.6 - 8.0   ?? Protein (UA POC) Negative Negative mg/dL   ?? Urobilinogen (UA POC) 0.2 mg/dL 0.2 - 1   ?? Nitrites (UA POC) Negative Negative   ?? Leukocyte esterase (UA POC) Negative Negative   ??  NM RENAL SCAN FLOW/FUNC W PHARM SNGL 10/11/14 - report and imaging reviewed:  FINDINGS: 9 mCi technetium 99 in MAG3 were given intravenously. 20 mg of Lasix  were given intravenously 15 minutes.  ????  Imaging of the kidneys were obtained over 45 minutes.  ????  Aortic renal time: normal less than 3 seconds  left kidney >3 seconds  right kidney 2 seconds   ????  Transit time: Normal less than 5 minutes  Left kidney 26.5 minutes  Right kidney 6minutes  ????  Excretory phase  T half max: Normal less than 10 minute  Left 96 minutes  Right 14.5 minutes  ????  T half post Lasix: Normal less than 15 minutes  Left 69.25 minutes   Right 3.25 minutes   ????  Percent excretion at 10 minutes: Normal greater than 50%  Left not applicable %  Right 14.6%  ????  Relative renal uptake: Normal 44-56%  Left 46%  Right 54%  ????  IMPRESSION:  1. Right kidney demonstrates mild delayed uptake and moderate decreased  excretion of isotope. Normal washout with Lasix. Findings consistent with  underlying medical renal disease. No obstruction.  2. Left kidney demonstrates marked decrease uptake and increased accumulation of  isotope with Lasix. Poor excretion. Findings consistent with obstruction.  ??  CT Abdomen and Pelvis With Contrast 10/04/14 - report and imaging reviewed:  History: Lower pelvic pain for one week. Hematuria.   IMPRESSION: Chronic left UPJ obstruction. 3.1 cm right ovarian cyst. Free pelvic  fluid.  ????  Comment: CT images of the abdomen and pelvis were obtained following the  administration of oral and intravenous contrast.  ????  The lung bases are clear.  ????  The liver spleen pancreas adrenal glands and gallbladder are unremarkable.  ????  There is marked hydronephrosis and dilatation of the left renal pelvis secondary  to ureteropelvic junction obstruction. Otherwise the kidneys ureters and bladder  are normal.  ????  A 3 cm cyst resides in the right adnexa. In addition there is free pelvic fluid  compatible with cyst rupture. Small follicular cysts are noted in the left  ovary. The uterus is unremarkable.  ????  No bowel obstruction, free intraperitoneal air or appendicitis.  ??  ??  Ruben Reason, MD   Assistant Professor   [facility] Dept of Urology   [facility] [place] [place]  ??  CC: Benay Spice, MD  ??  Medical Documentation is provided with the assistance of Frederich Cha, medical scribe for Ruben Reason, MD.

## 2015-03-06 NOTE — Progress Notes (Signed)
Problem: Falls - Risk of  Goal: *Absence of falls  Outcome: Progressing Towards Goal  Pt remained free of falls this shift. Safety measures in place like call bell within reach and bed in lowest position

## 2015-03-06 NOTE — Op Note (Signed)
[facility]  Inpatient Operation Report  NAME:  Kim [name], Kim [name]  SEX:   F  DATE: 03/06/2015  DOB: 1996-01-20  MR#    [id number]  ROOM:  [id number]  ACCT#  [id number]        PREOPERATIVE DIAGNOSIS:  Left ureteropelvic junction obstruction.    POSTOPERATIVE DIAGNOSIS:  Left ureteropelvic junction obstruction.    PROCEDURE:  Robotic-assisted laparoscopic left dismembered pyeloplasty with antegrade   stent placement and cystoscopy.    SURGEON:  Dr. Judie PetitMalcolm    ASSISTANT:  Dr. Bonney RousselSirohi (Fellow)    RESIDENT:  Dr. Einar Gradob Strehlow.    SURGICAL ASSISTANT:   R. Keeling.      ANESTHESIA:  General.    FINDINGS:  Left ureteropelvic junction obstruction with anterior crossing vessel;   dismembered pyeloplasty was performed with transposition of Kim lower pole   vessel, stent was placed.  Cystoscopy confirmed good stent position.    SPECIMENS:  Left ureteropelvic junction.    BLOOD LOSS:  Less  than 25 mL    DRAINS:  Left 6-French x 26 cm double-J stent and 18-French Foley catheter also   15-French round JP.    COMPLICATIONS:  None.    HISTORY:  A 19 year old female who presents for pyeloplasty for left ureteropelvic   junction obstruction.    DESCRIPTION OF PROCEDURE:  Informed consent was obtained.  Kim [name] was marked on Kim left side.  Kim [name]   was given subcutaneous heparin for deep vein thrombosis prophylaxis and IV   Ancef for bacterial prophylaxis.  Her urine pregnancy test was confirmed   negative.  Kim [name] was taken to Kim operating room.  General anesthesia was   provided.  A Foley catheter was placed to drain Kim bladder.  Kim [name] was   positioned right lateral decubitus with Kim left flank elevated about 70   degrees, an axillary roll was placed.  Left arm was placed in Kim padded   support and all pressure points were meticulously padded.  Kim table was   flexed slightly and Kim [name] was secured to Kim table with soft straps of Kim torso   and lower extremities.  Kim [name] was prepped and draped sterilely.  We had a    time-out confirming Kim [name] identification, planned procedure, surgical   site and all present were in agreement.  A Veress needle was placed lateral to   Kim left rectus belly at Kim level of Kim umbilicus.  Aspiration, irrigation   and saline drop test confirmed good positioning.  Pneumoperitoneum was   initiated with an opening pressure of 7 mmHg.  Kim abdomen was insufflated to   15 mmHg.  Marked four trocar sites which included 2 Robotic arms 1.5 cm apart   in Kim mid clavicular line, a 12 mm trocar between for Kim camera and Kim   midline and another 12 mm trocar in Kim umbilicus for Kim assistant.  Kim 5 mm   trocar was placed at one of Kim robotic trocar sites using a visual   obturator.  Kim abdomen was surveyed.  There were no abnormalities or   injuries, other than dilated renal pelvis which was easily apparent.  All Kim   remaining trocars were placed under laparoscopic vision.  At Kim two 12 mm   trocar sites, placed a 0 Vicryl with Carter-Thomason device for closure of Kim   fascia at Kim end of Kim case.  Once all trocars were in position, Kim robot   was docked.  Kim white line  of Toldt was incised and Kim left colon was   reflected, we could see a very dilated renal pelvis.  Kim gonadal vein was   identified and medialized, I then identified Kim ureter and traced it up to   Kim UPJ, taking care not to skeletonize Kim ureter.  I cleaned off Kim   anterior surface of Kim ureter and renal pelvis at Kim UPJ and I noted lower   pole crossing vessels anterior to Kim UPJ severe hydronephrosis was noted.    Kim lower pole vessels were mobilized off of Kim UPJ and Kim UPJ was   circumferentially exposed.  I divided Kim ureter just below Kim UPJ and then   spatulated Kim proximal aspect of Kim ureter for about 1.5 cm.  Kim UPJ was   then excised and sent for pathology.  Kim renal pelvis was nicely decompressed   at this point.  Kim apex of Kim spatulated ureter was secured to Kim    dependent portion of Kim renal pelvis with a couple of interrupted 4-0   Monocryl sutures in a tension free fashion.  This of course was done  anterior   to Kim lower pole crossing vessels.  Kim posterior anastomotic plate was   closed with a running 4-0 Monocryl suture.  Next a  Glidewire was advanced down Kim ureter and a 6 x 26 double-J stent was   advanced over Kim wire.  Kim wire was removed and Kim stent position looked   good.  Kim proximal curl was tucked into Kim renal pelvis.  Kim renal pelvis   was copiously irrigated and there was good hemostasis in Kim renal pelvis.    Kim anterior anastomosis was completed with another running 4-0 Monocryl   suture and Kim 2 suture lines were then tied down securely at Kim apex.  Kim   anastomosis was inspected circumferentially and it was watertight, widely   spatulated and tension free.  At this point, we reduced Kim pneumoperitoneal   pressure down to 5 mmHg and hemostasis was confirmed throughout Kim surgical   field.  We put some FloSeal around Kim UPJ.  All Kim needles and sutures were   removed and our sponge, needle and instrument count was correct at this time.    A 15 French round JP was positioned over Kim kidney.  Kim robot was then   undocked.  Kim trocars were removed.  Kim drain was secured to Kim skin with a   nylon suture.  Kim 0 Vicryls were tied down securely to close Kim fascia at   Kim midline 12 mm trocar sites.  All Kim wounds were irrigated and then   infiltrated with 0.25% Marcaine plain.  Kim skin edges were reapproximated   with 4-0 Monocryl in subcuticular fashion.  Dermabond was applied.  Drain   dressing was applied.  Kim [name] was then positioned frog legged and Kim   Foley catheter was removed.  Kim perineum was prepped and a flexible   cystoscope was introduced into Kim bladder.  We confirmed that Kim distal   stent curl was in good position.  Kim bladder otherwise appeared normal.  Kim    scope was removed.  An 18-French Foley catheter was placed to drain Kim   bladder.  Kim [name] was then transferred to stretcher.  Kim [name] was awakened   from anesthesia and transferred to recovery in stable condition.  There were   no complications and Kim [name] tolerated Kim procedure well.  Operative   events were discussed with Kim [name]'s family members.    DISPOSITION:  Kim [name] will be admitted for postoperative care.  We will do a void trial tomorrow   morning.  A JP creatinine a few hours later and hopefully get Kim JP out   tomorrow and discharged home in Kim afternoon.  Kim [name] will follow up with me in   Kim office in 3 to 4 weeks for cystoscopy and stent removal in Kim office.      ___________________  Ruben ReasonJohn B Hibba Schram M.D.  Dictated By:.   FS  D:03/06/2015 11:06:12  T: 03/06/2015 12:37:43  [id number]

## 2015-03-06 NOTE — Other (Signed)
TRANSFER - OUT REPORT:    Verbal report given to [facility] on Christianne Borrowlaire Simpson  being transferred to 4 west(unit) for routine post - op       Report consisted of patient???s Situation, Background, Assessment and   Recommendations(SBAR).     Information from the following report(s) OR Summary, Procedure Summary, Intake/Output and MAR was reviewed with the receiving nurse.    Lines:   Peripheral IV 03/06/15 Right Hand (Active)       Peripheral IV 03/06/15 Left Hand (Active)        Opportunity for questions and clarification was provided.      Patient transported with:   O2 @ 3 liters  Tech

## 2015-03-06 NOTE — Anesthesia Post-Procedure Evaluation (Signed)
Post-Anesthesia Evaluation and Assessment    Patient: Kim [name] MRN: [id number]  SSN: [id number]    Date of Birth: 04/16/1995  Age: 19 y.o.  Sex: female       Cardiovascular Function/Vital Signs  Visit Vitals   ??? BP 118/49   ??? Pulse (!) 104   ??? Temp 36.1 ??C (97 ??F)   ??? Resp 22   ??? Ht 5\' 11"  (1.803 m)   ??? Wt 73 kg (160 lb 15 oz)   ??? SpO2 100%   ??? BMI 22.45 kg/m2       Patient is status post general anesthesia for Procedure(s):  DAVINCI SI LEFT PYELOPLASTY; ANTEGRADE STENT & CYSTOSCOPY.    Nausea/Vomiting: None    Postoperative hydration reviewed and adequate.    Pain:  Pain Scale 1: Numeric (0 - 10) (03/06/15 1123)  Pain Intensity 1: 9 (03/06/15 1123)   Managed    Neurological Status:   Neuro (WDL): Within Defined Limits (pt not awake from anesth.) (03/06/15 1053)   At baseline    Mental Status and Level of Consciousness: Arousable    Pulmonary Status:   O2 Device:  (oral ariway d/ced) (03/06/15 1104)   Adequate oxygenation and airway patent    Complications related to anesthesia: None    Post-anesthesia assessment completed. No concerns    Signed By: A. Dan EuropeMorgan Tyland Klemens, MD     March 06, 2015

## 2015-03-07 LAB — CBC W/O DIFF
HCT: 37.4 % (ref 37.0–50.0)
HGB: 13.3 gm/dl (ref 13.0–17.2)
MCH: 30.3 pg (ref 25.4–34.6)
MCHC: 35.6 gm/dl (ref 30.0–36.0)
MCV: 85.2 fL (ref 80.0–98.0)
MPV: 9.7 fL (ref 6.0–10.0)
PLATELET: 281 10*3/uL (ref 140–450)
RBC: 4.39 M/uL (ref 3.60–5.20)
RDW-SD: 39.4 (ref 36.4–46.3)
WBC: 12.8 10*3/uL — ABNORMAL HIGH (ref 4.0–11.0)

## 2015-03-07 LAB — METABOLIC PANEL, BASIC
BUN: 8 mg/dl (ref 7–25)
CO2: 25 mEq/L (ref 21–32)
Calcium: 8.8 mg/dl (ref 8.5–10.1)
Chloride: 106 mEq/L (ref 98–107)
Creatinine: 0.8 mg/dl (ref 0.6–1.3)
GFR est AA: >60
GFR est non-AA: >60
Glucose: 108 mg/dl — ABNORMAL HIGH (ref 74–106)
Potassium: 4.2 mEq/L (ref 3.5–5.1)
Sodium: 140 mEq/L (ref 136–145)

## 2015-03-07 LAB — CREATININE, FLUID: CREATININE, BODY FLUID: 0.73 mg/dl

## 2015-03-07 MED ORDER — SODIUM CHLORIDE 0.9% BOLUS IV
0.9 % | INTRAVENOUS | Status: AC
Start: 2015-03-07 — End: 2015-03-06
  Administered 2015-03-07: 01:00:00 via INTRAVENOUS

## 2015-03-07 MED ORDER — KETOROLAC TROMETHAMINE 30 MG/ML INJECTION
30 mg/mL (1 mL) | Freq: Four times a day (QID) | INTRAMUSCULAR | Status: DC
Start: 2015-03-07 — End: 2015-03-07
  Administered 2015-03-07 (×3): via INTRAVENOUS

## 2015-03-07 MED FILL — MORPHINE 2 MG/ML INJECTION: 2 mg/mL | INTRAMUSCULAR | Qty: 1

## 2015-03-07 MED FILL — HYDROCODONE-ACETAMINOPHEN 5 MG-325 MG TAB: 5-325 mg | ORAL | Qty: 1

## 2015-03-07 MED FILL — DOCUSATE SODIUM 100 MG CAP: 100 mg | ORAL | Qty: 1

## 2015-03-07 MED FILL — KETOROLAC TROMETHAMINE 30 MG/ML INJECTION: 30 mg/mL (1 mL) | INTRAMUSCULAR | Qty: 1

## 2015-03-07 MED FILL — HEPARIN (PORCINE) 5,000 UNIT/ML IJ SOLN: 5000 unit/mL | INTRAMUSCULAR | Qty: 1

## 2015-03-07 MED FILL — FLUARIX QUAD 2016-2017 (PF) 60 MCG (15 MCG X 4)/0.5 ML IM SYRINGE: 60 mcg (15 mcg x 4)/0.5 mL | INTRAMUSCULAR | Qty: 0.5

## 2015-03-07 MED FILL — CEFAZOLIN 2 GM/50 ML IN DEXTROSE (ISO-OSMOTIC) IVPB: 2 gram/50 mL | INTRAVENOUS | Qty: 50

## 2015-03-07 MED FILL — OXYBUTYNIN CHLORIDE 5 MG TAB: 5 mg | ORAL | Qty: 1

## 2015-03-07 MED FILL — SODIUM CHLORIDE 0.9 % IV: INTRAVENOUS | Qty: 500

## 2015-03-07 NOTE — Progress Notes (Signed)
Patient has voided, 300ml of Red colored and clear urine. PVR is <6250ml. Patient walked the halls with no complaints or concerns. Pain is tolerable. Will continue to encourage pt to ambulate.

## 2015-03-07 NOTE — Progress Notes (Signed)
Problem: Falls - Risk of  Goal: *Absence of falls  Outcome: Progressing Towards Goal  Pt has remained free of falls. Fall prevention in place. Will continue to monitor.

## 2015-03-07 NOTE — Progress Notes (Signed)
Tentative dc plan:    D/C Plan Home - No anticipated needs at this time     Anticipated Discharge Date:   03/07/15    PCP: Benay SpiceAVID V PALMISANO, MD       Home Environment:    [place]ives at [address]  [place] [id number]    [phone].      Extended Emergency Contact Information  Primary Emergency Contact: [facility],Diane   [place]  Mobile Phone: [phone]  Relation: Mother        Case Management Assessment    ABUSE/NEGLECT SCREENING   Physical Abuse/Neglect: Denies   Sexual Abuse: Denies   Sexual Abuse: Denies   Other Abuse/Issues: Denies          PRIMARY DECISION MAKER   Primary Decision Maker Name: Lanice SchwabDiane Harmening   Primary Decision Maker Phone Number: [phone]       Primary Decision Maker Relationship to Patient: Parent                   CARE MANAGEMENT INTERVENTIONS       PCP Verified by CM: Yes   Last Visit to PCP: 02/21/15       Mode of Transport at Discharge: Other (see comment) (parents )       Transition of Care Consult (CM Consult): Discharge Planning           MyChart Signup: No   Discharge Durable Medical Equipment: No   Physical Therapy Consult: No   Occupational Therapy Consult: No   Speech Therapy Consult: No   Current Support Network: Other, Relative's Home (Parents )   Reason for Referral: DCP Rounds   History Provided By: Patient   Patient Orientation: Alert and Oriented, Self, Person, Place, Situation   Cognition: Alert       Previous Living Arrangement: Lives with Family Independent       Prior Functional Level: Independent in ADLs/IADLs   Current Functional Level: Independent in ADLs/IADLs   Primary Language: English   Can patient return to prior living arrangement: Yes   Ability to make needs known:: Good   Family able to assist with home care needs:: Yes                       Confirm Follow Up Transport: Family   Confirm Transport and Arrange: Yes   Plan discussed with Pt/Family/Caregiver: Yes          DISCHARGE LOCATION   Discharge Placement: Home with family assistance

## 2015-03-07 NOTE — Other (Signed)
Bedside and Verbal shift change report given to Johanna RN (oncoming nurse) by Josephine RN (offgoing nurse). Report included the following information SBAR, Kardex, Intake/Output and MAR.

## 2015-03-07 NOTE — Discharge Summary (Signed)
Physician Discharge Summary     Patient ID:  Kim BorrowClaire Beer  [id number]  [id number]  19 y.o.  04/22/1995    Admit Date: 03/06/2015    Discharge Date: 03/07/2015    * Admission Diagnoses: URETEROPLEVIC JUNCTION OBSTRUCTION; (N13.5)  UPJ (ureteropelvic junction) obstruction    * Discharge Diagnoses:    Hospital Problems as of 03/07/2015  Date Reviewed: 03/06/2015          Codes Class Noted - Resolved POA    UPJ (ureteropelvic junction) obstruction ICD-10-CM: N13.5  ICD-9-CM: 593.4  03/06/2015 - Present Unknown               Admission Condition: Good    * Discharge Condition: good    * Procedures: Procedure(s) with comments:  DAVINCI SI LEFT PYELOPLASTY; ANTEGRADE STENT & CYSTOSCOPY - DVINCI ROBOT SI    * Hospital Course:   Normal hospital course for this procedure.  Foley removed POD 1, voiding well.  JP creat 0.73, drain removed    Consults: None    Significant Diagnostic Studies: labs: Results for Kim [name], [name] (MRN [id number]) as of 03/07/2015 15:11   Ref. Range 03/07/2015 02:37   Sodium Latest Ref Range: 136 - 145 mEq/L 140   Potassium Latest Ref Range: 3.5 - 5.1 mEq/L 4.2   Chloride Latest Ref Range: 98 - 107 mEq/L 106   CO2 Latest Ref Range: 21 - 32 mEq/L 25   Glucose Latest Ref Range: 74 - 106 mg/dl 244108 (H)   BUN Latest Ref Range: 7 - 25 mg/dl 8   Creatinine Latest Ref Range: 0.6 - 1.3 mg/dl 0.8   Calcium Latest Ref Range: 8.5 - 10.1 mg/dl 8.8   GFR est non-AA Latest Units:   >60   GFR est AA Latest Units:   >60.0   Results for Kim [name], Kim (MRN [id number]) as of 03/07/2015 15:11   Ref. Range 03/07/2015 02:37   WBC Latest Ref Range: 4.0 - 11.0 1000/mm3 12.8 (H)   RBC Latest Ref Range: 3.60 - 5.20 M/uL 4.39   HGB Latest Ref Range: 13.0 - 17.2 gm/dl 53.613.3   HCT Latest Ref Range: 37.0 - 50.0 % 37.4   MCV Latest Ref Range: 80.0 - 98.0 fL 85.2   MCH Latest Ref Range: 25.4 - 34.6 pg 30.3   MCHC Latest Ref Range: 30.0 - 36.0 gm/dl 64.435.6   RDW-SD Latest Ref Range: 36.4 - 46.3   39.4   PLATELET Latest Ref Range: 140 - 450 1000/mm3 281    MPV Latest Ref Range: 6.0 - 10.0 fL 9.7     JPW DRAIN   CREATININE, BODY FLUID 0.73   mg/dl Final       * Disposition: Home    Discharge Medications:   Current Discharge Medication List      START taking these medications    Details   !! HYDROcodone-acetaminophen (NORCO) 5-325 mg per tablet Take 1-2 Tabs by mouth every four (4) hours as needed for Pain. Max Daily Amount: 12 Tabs.  Qty: 30 Tab, Refills: 0      docusate sodium (COLACE) 100 mg capsule Take 1 Cap by mouth three (3) times daily as needed for Constipation (Take while taking narcotic pain meds to prevent constipation.  Continue while on narcotics.) for up to 90 days.  Qty: 90 Cap, Refills: 0      oxybutynin chloride XL (DITROPAN XL) 5 mg CR tablet Take 1 Tab by mouth daily. Take once daily as needed for bladder spasms.  Qty: 60 Tab, Refills:  0       !! - Potential duplicate medications found. Please discuss with provider.      CONTINUE these medications which have NOT CHANGED    Details   naproxen (NAPROSYN) 250 mg tablet Take  by mouth two (2) times daily (with meals).      !! HYDROCODONE/ACETAMINOPHEN (LORTAB 5-325 PO) Take  by mouth.       !! - Potential duplicate medications found. Please discuss with provider.          * Follow-up Care/Patient Instructions:  Activity: No lifting, Driving, or Strenuous exercise for 2 weeks  Diet: Regular Diet  Wound Care: Keep wound clean and dry    Follow-up Information     None        Follow-up tests/labs f/u with Dr. Judie Petit    Signed:  Page Spiro, MD  03/07/2015  3:10 PM

## 2015-03-07 NOTE — Progress Notes (Signed)
Problem: Falls - Risk of  Goal: *Absence of falls  Outcome: Progressing Towards Goal  Patient free of falls at this time. Fall precaution in place. Will continue to monitor.

## 2015-03-07 NOTE — Progress Notes (Signed)
Late entry: nurse called me last night reporting low UOP.  i gave orders for fluid bolus, stop toradol, and irrigate foley x1.  She called me back a few minutes later: when she manipulated foley for irrigation, over 1000ml clear urine came out of Foley.   JP output 70 since then.  Will still proceed with VT this am but adding orders for PVRs and will monitor JP output closely since she had this episode of bladder distension and probably put a little strain on the suture lines at the UPJ.    Spoke with nurse.      Ruben ReasonJohn B. Aundria Bitterman, MD  Assistant Professor  [facility] Dept [facility]  [facility] [place] [place]

## 2015-03-07 NOTE — Progress Notes (Signed)
Subjective:     Postop pain.  Foley cath removed this AM, has not voided yet.    Objective:     Data Review:   Visit Vitals   ??? BP 124/52 (BP 1 Location: Right arm, BP Patient Position: Head of bed elevated (Comment degrees))   ??? Pulse 65   ??? Temp 97.9 ??F (36.6 ??C)   ??? Resp 16   ??? Ht 5\' 11"  (1.803 m)   ??? Wt 73 kg (160 lb 15 oz)   ??? LMP 02/16/2015   ??? SpO2 100%   ??? Breastfeeding No   ??? BMI 22.45 kg/m2         Intake/Output Summary (Last 24 hours) at 03/07/15 0919  Last data filed at 03/07/15 0725   Gross per 24 hour   Intake 3391.66 ml   Output   2795 ml   Net 596.66 ml       Physical Exam:  General appearance: fatigued, cooperative, appears stated age  Head: Normocephalic, without obvious abnormality, atraumatic  Abdomen: soft, non-distended, incision clean and dry  Extremities: extremities normal, atraumatic, no cyanosis or edema  Skin: Skin color, texture, turgor normal. No rashes or lesions  Neurologic: Grossly normal    Labs:     Recent Labs      03/07/15   0237   WBC  12.8*   HGB  13.3   HCT  37.4   PLT  281     Recent Labs      03/07/15   0237   NA  140   K  4.2   CL  106   CO2  25   GLU  108*   BUN  8   CREA  0.8   CA  8.8            Assessment/Plan:     URETEROPLEVIC JUNCTION OBSTRUCTION; (N13.5)  UPJ (ureteropelvic junction) obstruction    POD 1 left robotic pyeloplasty.  Foley malfunction with bladder distension to 1000cc noted  Cath has been removed for voiding trial this AM.  Will check PVR's and send JP fluid for creatinine  Ambulate in hallway.          Page SpiroJohn S. Kynnadi Dicenso, MD, FACS

## 2015-03-31 ENCOUNTER — Ambulatory Visit
Admit: 2015-03-31 | Discharge: 2015-03-31 | Payer: PRIVATE HEALTH INSURANCE | Attending: Urology | Primary: Family Medicine

## 2015-03-31 DIAGNOSIS — N135 Crossing vessel and stricture of ureter without hydronephrosis: Secondary | ICD-10-CM

## 2015-03-31 LAB — AMB POC URINALYSIS DIP STICK AUTO W/O MICRO
Bilirubin (UA POC): NEGATIVE
Glucose (UA POC): NEGATIVE
Ketones (UA POC): NEGATIVE
Nitrites (UA POC): NEGATIVE
Specific gravity (UA POC): 1.03 (ref 1.001–1.035)
Urobilinogen (UA POC): 0.2 (ref 0.2–1)
pH (UA POC): 5.5 (ref 4.6–8.0)

## 2015-03-31 NOTE — Progress Notes (Signed)
CYSTOSCOPY PROCEDURE        Patient Name: Kim BorrowClaire Marchese            Date of Procedure: 03/31/2015     Pre-procedure Diagnosis:     ICD-10-CM ICD-9-CM    1. Ureteropelvic junction (UPJ) obstruction N13.5 593.4 NM RENAL SCAN FLOW/FUNC W PHARM SNGL      AMB POC URINALYSIS DIP STICK AUTO W/O MICRO      CYSTOSCOPY,REMV CALCULUS,SIMPLE        Post-procedure Diagnosis: Same    Consent:  All risks, benefits and options were reviewed in detail and the patient agrees to procedure. Risks include but are not limited to bleeding, infection, sepsis, death, dysuria and others.     Procedure:  The patient was placed in the lithotomy position, and prepped and draped in the normal fashion. 5 ml of 4% Lidocaine gel was placed in the urethra. Once adequate anesthesia was achieved; the flexible cystoscope was placed into the bladder.     Findings as follows:      Meatus: normal  Urethra: normal  Bladder neck: normal  Trigone:  normal  Trabeculation:normal  Diverticuli:  none  Lesion:  none  Stent visualized, grasped and removed intact without difficulty  Antibiotic provided:  Yes     Lab / Imaging:   Results for orders placed or performed in visit on 03/31/15   AMB POC URINALYSIS DIP STICK AUTO W/O MICRO   Result Value Ref Range    Color (UA POC) Yellow     Clarity (UA POC) Clear     Glucose (UA POC) Negative Negative    Bilirubin (UA POC) Negative Negative    Ketones (UA POC) Negative Negative    Specific gravity (UA POC) 1.030 1.001 - 1.035    Blood (UA POC) 3+ Negative    pH (UA POC) 5.5 4.6 - 8.0    Protein (UA POC) 1+ Negative mg/dL    Urobilinogen (UA POC) 0.2 mg/dL 0.2 - 1    Nitrites (UA POC) Negative Negative    Leukocyte esterase (UA POC) Trace Negative        Physical Exam:  Visit Vitals   ??? BP 100/60   ??? Ht 5\' 11"  (1.803 m)   ??? Wt 72.6 kg (160 lb)   ??? BMI 22.32 kg/m2     GEN: NAD, alert and oriented  PULM: nl resp effort, no distress  ABD: incisions c/d/i, no hernias, no r/g   EXT: well perfused, no c/c/e, calves nt, neg Homan's signs  PSYCH: Nl mood and affect    Diagnoses:       ICD-10-CM ICD-9-CM    1. Ureteropelvic junction (UPJ) obstruction N13.5 593.4 NM RENAL SCAN FLOW/FUNC W PHARM SNGL      AMB POC URINALYSIS DIP STICK AUTO W/O MICRO      CYSTOSCOPY,REMV CALCULUS,SIMPLE        S/p robotic lap left dismembered pyeloplasty with antegrade stent placement and cysto on 03/07/15 for UPJ obstruction. She states that she has flank pain during the end of the day. Some gross hematuria. No CP or SOB.  Should resolve following stent removal.    SUMMARY OF VISIT AND PLAN:  Stent removed intact  Lasix renal scan in 3mos and f/u to review    Ruben ReasonJohn B. Tinita Brooker, MD  Assistant Professor  [facility] [facility]rology  [facility] [place] [place]    CC: Benay SpiceAVID V PALMISANO, MD    Medical documentation provided with the assistance of  Bud Face, medical scribe to Ruben Reason, MD.

## 2015-03-31 NOTE — Progress Notes (Signed)
Order given to MRI CT for scheduling for March

## 2015-05-04 NOTE — Telephone Encounter (Signed)
Informed pt to stop doing strenuous activities for 2 week and give [place] a call if pain persist.Pt agreed.Charyl Dancer

## 2015-05-04 NOTE — Telephone Encounter (Signed)
Pt calling in complaining of pain in her lower back and scars are. She stated she started her intense workouts on 04/21/15 and the pain is horrific.She is down at school in [place].//SDusza

## 2015-05-08 ENCOUNTER — Encounter

## 2015-05-08 NOTE — Telephone Encounter (Signed)
Pt requesting a note to fax to her school [phone].Extend to 3 weeks with no strenuous exercise/activity and if pain persist to order a renal lasix scan per verbal order of Dr Judie Petit last Friday.Charyl Dancer

## 2015-05-23 NOTE — Telephone Encounter (Signed)
Patient called and asked if she can get a letter printed stating that she can return to her normal activities and game playing.  Patient plays softball and was asked to get a letter from her doctor.  She asked if it can be faxed to her trainer Cathlyn Parsons @  [phone]

## 2015-05-23 NOTE — Telephone Encounter (Signed)
Mom stated pt wanted to start this week coz she's feeling better. Faxed note to above number.Charyl Dancer

## 2015-05-23 NOTE — Telephone Encounter (Signed)
Mom cancelled the appt for March and reschedule on May due to her school commitment.Kim [name]

## 2015-05-25 NOTE — Telephone Encounter (Signed)
Dad called requesting to have renal lasix scan now and appt on Friday because her school will not let her do the sports until the renal lasix scan is done and been evaluated by PA/Physcician. Informed Dr Judie Petit is not in the office this week and given appt to see C. Reyes,PA.Charyl Dancer

## 2015-05-26 ENCOUNTER — Ambulatory Visit
Admit: 2015-05-26 | Discharge: 2015-05-26 | Payer: PRIVATE HEALTH INSURANCE | Attending: Physician Assistant | Primary: Family Medicine

## 2015-05-26 DIAGNOSIS — N135 Crossing vessel and stricture of ureter without hydronephrosis: Secondary | ICD-10-CM

## 2015-05-26 NOTE — Progress Notes (Signed)
Kim [name]  09-02-1995      ASSESSMENT:     ICD-10-CM ICD-9-CM    1. Ureteropelvic junction (UPJ) obstruction N13.5 593.4    2. Hydronephrosis of left kidney N13.30 591       Left UPJO by CT a/p with contrast 09/2014. F/u renal lasix scan demonstrates marked decrease uptake and increased accumulation of isotope with Lasix in left kidney. Poor excretion. Findings consistent with obstruction. T 1/2 69 min. Split renal function: left 46% and right 54%.     -S/p robotic lap left dismembered pyeloplasty with antegrade stent placement and cysto on 03/07/15    -Stent removal 03/31/15   -Renal lasix scan this morning (05/26/15) - Left kidney: Pre-lasix T1/2 11.58minutes, post-lasix T1/2 in 5.18 minutes; split function differential: Left kidney:  45.3%, 54.7% on right    Crossing lower pole vessel.    PLAN:    Reviewed renal lasix scan from earlier today with the patient and her father.  No significant obstruction, left sided pyelocaliectasis without significant obstruction  Reviewed with Dr Daphine Deutscher also.  Gave her return to school note, clearing her to play sports without physical restrictions.   She has appt scheduled in May 2017 with Dr Judie Petit with piror renal lasix scan.  I will discuss with him if he still wants this done    Chief Complaint   Patient presents with   ??? Other     H/o left UPJ obstruction s/p repair by Dr Judie Petit in 02/2015, stent removed in 03/2015     HISTORY OF PRESENT ILLNESS: Kim [name] is a 20 y.o. female who presents today with her father for follow up of left UPJO s/p  robotic lap left dismembered pyeloplasty with antegrade stent placement and cysto on 03/07/15, ureteral stent removed 03/31/15.   She had a renal lasix scan earlier today that did not show significant obstruction. She continues to recover and still has some back pain with activity.  She plays softball and reports back pain intensity 3/10 with sports, 0/10 at rest.  No back pain with voiding. She denies dysuria,  hematuria, abdominal pain, fever, n/v.     Past Medical History   Diagnosis Date   ??? Chronic kidney disease      congenital left hydronephrosis   ??? Hydronephrosis      congenital left side   ??? Reactive airway disease        Past Surgical History   Procedure Laterality Date   ??? Hx tonsil and adenoidectomy         Social History   Substance Use Topics   ??? Smoking status: Never Smoker   ??? Smokeless tobacco: Never Used   ??? Alcohol use No       No Known Allergies    Family History   Problem Relation Age of Onset   ??? Cancer Paternal Grandmother    ??? Cancer Paternal Grandfather        Current Outpatient Prescriptions   Medication Sig Dispense Refill   ??? PROAIR HFA 90 mcg/actuation inhaler      ??? FLOVENT HFA 220 mcg/actuation inhaler          Review of Systems  Constitutional: Fever: No  Skin: Rash: No  HEENT: Hearing difficulty: No  Eyes: Blurred vision: No  Cardiovascular: Chest pain: No  Respiratory: Shortness of breath: No  Gastrointestinal: Nausea/vomiting: No  Musculoskeletal: Back pain: No  Neurological: Weakness: No  Psychological: Memory loss: No  Comments/additional findings:  PHYSICAL EXAMINATION:   Visit Vitals   ??? BP 118/70   ??? Ht  (1.803 m)   ??? Wt 160 lb (72.6 kg)   ??? BMI 22.32 kg/m2     GEN: NAD, alert and oriented  PULM: nl resp effort, no distress  Abd: incisions healed, soft, NTND, no hernias, no CVAT  EXT: well perfused  PSYCH: Nl mood and affect    REVIEW OF LABS AND IMAGING:    Results for orders placed or performed in visit on 03/31/15   AMB POC URINALYSIS DIP STICK AUTO W/O MICRO   Result Value Ref Range    Color (UA POC) Yellow     Clarity (UA POC) Clear     Glucose (UA POC) Negative Negative    Bilirubin (UA POC) Negative Negative    Ketones (UA POC) Negative Negative    Specific gravity (UA POC) 1.030 1.001 - 1.035    Blood (UA POC) 3+ Negative    pH (UA POC) 5.5 4.6 - 8.0    Protein (UA POC) 1+ Negative mg/dL    Urobilinogen (UA POC) 0.2 mg/dL 0.2 - 1     Nitrites (UA POC) Negative Negative    Leukocyte esterase (UA POC) Trace Negative     Dario Guardian, PA    ??      ??

## 2015-05-29 ENCOUNTER — Encounter

## 2015-06-01 NOTE — Progress Notes (Signed)
Noted  Fwd to pt  Looks great.  Can follow up with me in 1 yr with repeat lasix renal scan prior to appt.  Doesn't need to see me in may unless she is having symptoms/problems.

## 2015-06-05 ENCOUNTER — Encounter

## 2015-06-05 NOTE — Progress Notes (Signed)
Sent patient letter with follow up in 1 year

## 2015-06-09 NOTE — Progress Notes (Signed)
Imaging placed in future folder

## 2015-07-07 ENCOUNTER — Encounter: Attending: Urology | Primary: Family Medicine

## 2015-09-08 ENCOUNTER — Encounter: Attending: Urology | Primary: Family Medicine

## 2015-09-09 DIAGNOSIS — R42 Dizziness and giddiness: Secondary | ICD-10-CM

## 2015-09-09 NOTE — ED Triage Notes (Signed)
Pt c/o right side pain that began yesterday, radiating into RUQ.  Pt c/o nausea this morning & lightheadedness at this time.  Pain rated 8/10 at time of triage.

## 2015-09-10 ENCOUNTER — Emergency Department

## 2015-09-10 ENCOUNTER — Emergency Department: Admit: 2015-09-10 | Payer: TRICARE (CHAMPUS) | Primary: Family Medicine

## 2015-09-10 ENCOUNTER — Inpatient Hospital Stay
Admit: 2015-09-10 | Discharge: 2015-09-10 | Disposition: A | Discharge status: Home / Self Care | Payer: TRICARE (CHAMPUS) | Attending: Emergency Medicine

## 2015-09-10 LAB — POC URINE MACROSCOPIC
Bilirubin: NEGATIVE
Blood: NEGATIVE
Glucose: NEGATIVE mg/dl
Ketone: NEGATIVE mg/dl
Leukocyte Esterase: NEGATIVE
Nitrites: NEGATIVE
Protein: 30 mg/dl — AB
Specific gravity: 1.025 (ref 1.005–1.030)
Urobilinogen: 0.2 EU/dl (ref 0.0–1.0)
pH (UA): 6 (ref 5–9)

## 2015-09-10 LAB — CBC WITH AUTOMATED DIFF
BASOPHILS: 0.2 % (ref 0–3)
EOSINOPHILS: 3.9 % (ref 0–5)
HCT: 41.2 % (ref 37.0–50.0)
HGB: 14.7 gm/dl (ref 13.0–17.2)
IMMATURE GRANULOCYTES: 0.2 % (ref 0.0–3.0)
LYMPHOCYTES: 38.7 % (ref 28–48)
MCH: 29.9 pg (ref 25.4–34.6)
MCHC: 35.7 gm/dl (ref 30.0–36.0)
MCV: 83.9 fL (ref 80.0–98.0)
MONOCYTES: 6 % (ref 1–13)
MPV: 9.5 fL (ref 6.0–10.0)
NEUTROPHILS: 51 % (ref 34–64)
NRBC: 0 (ref 0–0)
PLATELET: 330 10*3/uL (ref 140–450)
RBC: 4.91 M/uL (ref 3.60–5.20)
RDW-SD: 38.4 (ref 36.4–46.3)
WBC: 8.5 10*3/uL (ref 4.0–11.0)

## 2015-09-10 LAB — METABOLIC PANEL, COMPREHENSIVE
ALT (SGPT): 18 U/L (ref 12–78)
AST (SGOT): 11 U/L — ABNORMAL LOW (ref 15–37)
Albumin: 3.9 gm/dl (ref 3.4–5.0)
Alk. phosphatase: 62 U/L (ref 45–117)
BUN: 10 mg/dl (ref 7–25)
Bilirubin, total: 0.4 mg/dl (ref 0.2–1.0)
CO2: 27 mEq/L (ref 21–32)
Calcium: 8.8 mg/dl (ref 8.5–10.1)
Chloride: 106 mEq/L (ref 98–107)
Creatinine: 0.8 mg/dl (ref 0.6–1.3)
GFR est AA: >60
GFR est non-AA: >60
Glucose: 81 mg/dl (ref 74–106)
Potassium: 3.9 mEq/L (ref 3.5–5.1)
Protein, total: 7 gm/dl (ref 6.4–8.2)
Sodium: 141 mEq/L (ref 136–145)

## 2015-09-10 LAB — LIPASE: Lipase: 177 U/L (ref 73–393)

## 2015-09-10 LAB — POC HCG,URINE: HCG urine, QL: NEGATIVE

## 2015-09-10 LAB — D DIMER: D DIMER: 0.27 ug/mL (FEU) (ref 0.01–0.50)

## 2015-09-10 LAB — D-DIMER, QUANTITATIVE: D-Dimer, Quant: 0.27 ug/mL (FEU) (ref 0.01–0.50)

## 2015-09-10 MED ORDER — SODIUM CHLORIDE 0.9% BOLUS IV
0.9 % | INTRAVENOUS | Status: AC
Start: 2015-09-10 — End: 2015-09-09
  Administered 2015-09-10: 03:00:00 via INTRAVENOUS

## 2015-09-10 MED ORDER — ONDANSETRON (PF) 4 MG/2 ML INJECTION
4 mg/2 mL | Freq: Once | INTRAMUSCULAR | Status: AC
Start: 2015-09-10 — End: 2015-09-10
  Administered 2015-09-10: 05:00:00 via INTRAVENOUS

## 2015-09-10 MED ORDER — SODIUM CHLORIDE 0.9 % IJ SYRG
Freq: Once | INTRAMUSCULAR | Status: AC
Start: 2015-09-10 — End: 2015-09-10
  Administered 2015-09-10: 05:00:00 via INTRAVENOUS

## 2015-09-10 MED ORDER — KETOROLAC TROMETHAMINE 30 MG/ML INJECTION
30 mg/mL (1 mL) | INTRAMUSCULAR | Status: AC
Start: 2015-09-10 — End: 2015-09-10
  Administered 2015-09-10: 07:00:00 via INTRAVENOUS

## 2015-09-10 MED ORDER — NAPROXEN 500 MG TAB
500 mg | ORAL_TABLET | Freq: Two times a day (BID) | ORAL | 0 refills | Status: AC
Start: 2015-09-10 — End: 2015-09-20

## 2015-09-10 MED ORDER — MORPHINE 4 MG/ML SYRINGE
4 mg/mL | INTRAMUSCULAR | Status: AC
Start: 2015-09-10 — End: 2015-09-10
  Administered 2015-09-10: 05:00:00 via INTRAVENOUS

## 2015-09-10 MED FILL — KETOROLAC TROMETHAMINE 30 MG/ML INJECTION: 30 mg/mL (1 mL) | INTRAMUSCULAR | Qty: 1

## 2015-09-10 MED FILL — MORPHINE 4 MG/ML SYRINGE: 4 mg/mL | INTRAMUSCULAR | Qty: 1

## 2015-09-10 MED FILL — ONDANSETRON (PF) 4 MG/2 ML INJECTION: 4 mg/2 mL | INTRAMUSCULAR | Qty: 2

## 2015-09-10 NOTE — ED Provider Notes (Addendum)
[facility]  Emergency Department Treatment Report        Patient: Kim [name] Age: 20 y.o. Sex: female    Date of Birth: 07/28/1995 Admit Date: 09/09/2015 PCP: Dulce Sellar, MD   MRN: [id number]  CSN: [id number]     Room: ER04/ER04 Time Dictated: 2:57 AM        Chief Complaint   Chief Complaint   Patient presents with   ??? Abdominal Pain       History of Present Illness   This is a 20 y.o. female presents ED due to 1 day of right flank pain rated 8 of 10 that began yesterday as the patient was driving described as waxing waning with intermittent radiation to the right upper quadrant. Patient also notes to have associated nausea and lightheadedness this a.m. 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 She was evaluated by Dr. Norberto Sorenson, her urologist for which surgery was done in November of last year and he said was placed to fix congenital defect. Stent was subsequently removed. Patient states that symptoms feel similar to previous episodes though on the right flank. Patient denies any fevers, dysuria, vomiting, diarrhea, constipation, bowel or bladder retention, bowel or bladder incontinence, chest pain, shortness of breath, lower extremity weakness, saddle anesthesia or other symptoms. She denies any heavy lifting. Patient does note that her right flank pain is worsened with deep inhalation. Patient states she is otherwise healthy. Patient's last bowel movement was this a.m. and normal. Abdominal surgeries include urology surgery with stent placement.    Review of Systems     Constitutional: No fever  ENT: No URI symptoms  Respiratory: Pain with inhalation, No shortness of breath, cough, wheezing  Cardiovascular: No chest pain, pressure  Gastrointestinal: Right flank pain, right upper quadrant pain, Nausea No vomiting, diarrhea, bowel retention, incontience  Genitourinary: No urinary symptoms, vaginal bleeding, discharge   Musculoskeletal: No joint pain or swelling. No lower extremity weakness.  Integumentary: No rashes  Neurological: Previous lightheadedness. None currently. No headaches, sensory, motor symptoms, saddle anesthesia    Denies complaints in all other systems.      Past Medical/Surgical History     Past Medical History:   Diagnosis Date   ??? Chronic kidney disease     congenital left hydronephrosis   ??? Hydronephrosis     congenital left side   ??? Reactive airway disease      Past Surgical History:   Procedure Laterality Date   ??? HX TONSIL AND ADENOIDECTOMY         Social History     Social History     Social History   ??? Marital status: SINGLE     Spouse name: N/A   ??? Number of children: N/A   ??? Years of education: N/A     Occupational History   ??? Not on file.     Social History Main Topics   ??? Smoking status: Never Smoker   ??? Smokeless tobacco: Never Used   ??? Alcohol use No   ??? Drug use: No   ??? Sexual activity: Not on file     Other Topics Concern   ??? Not on file     Social History Narrative       Family History     Family History   Problem Relation Age of Onset   ??? Cancer Paternal Grandmother    ??? Cancer Paternal Grandfather  Current Medications     None       Allergies   No Known Allergies    Physical Exam   ED Triage Vitals   Enc Vitals Group      BP 09/09/15 2105 126/65      Pulse (Heart Rate) 09/09/15 2105 60      Resp Rate 09/09/15 2105 12      Temp 09/09/15 2105 98.2 ??F (36.8 ??C)      Temp src --       O2 Sat (%) 09/09/15 2105 100 %      Weight 09/09/15 2105 165 lb      Height 09/09/15 2105 5' 11"       Head Cir --       Peak Flow --       Pain Score --       Pain Loc --       Pain Edu? --       Excl. in [place]? --        Constitutional: Patient appears well developed and well nourished.  Appearance and behavior are age and situation appropriate. Patient appears nontoxic here in ED but does appear to be in some discomfort.  HEENT: Conjunctiva clear.  Mucous membranes moist, non-erythematous.    Neck: supple, non tender with full range of motion.    Respiratory: lungs clear to auscultation, nonlabored respirations. No tachypnea or accessory muscle use.  Cardiovascular: heart regular rate and rhythm without murmur rubs or gallops.   Gastrointestinal:  Abdomen soft, with mild complaints of pain to palpation of the right upper quadrant and right flank. Right-sided CVA tenderness is noted. Murphy sign negative. Non-tender to palpation at McBurney's point.  Rosving sign negative.  No abdominal masses appreciated on inspection. No suprapubic tenderness elicited.  Musculoskeletal: Pt moving all 4 extremities freely with good range of motion and 5/5 strength of the upper and lower extremities equal.  Thoracic, lumbar, sacral midline spine without bony step offs, deformities, swelling.    Integumentary: warm and dry without rashes or lesions  Neurologic: alert and oriented, Sensation intact, motor strength equal and symmetric.  No facial asymmetry or dysarthria.  No saddle anesthesia is noted.                Impression and Management Plan   20 year old female who presents ED due to right flank pain, right upper quadrant abdominal pain. Patient also notes to have previous lightheadedness and nausea. We will treat her with normal saline bolus. She does have history of chronic kidney disease due to a congenital defect. Patient does have some dyspnea. We will obtain a CBC, CMP, lipase, urine dip, urine pregnancy, chest x-ray, d-dimer, CT of the abdomen and pelvis without contrast.      Procedures      Diagnostic Studies   Lab:   Results for orders placed or performed during the hospital encounter of 09/09/15   CBC WITH AUTOMATED DIFF   Result Value Ref Range    WBC 8.5 4.0 - 11.0 1000/mm3    RBC 4.91 3.60 - 5.20 M/uL    HGB 14.7 13.0 - 17.2 gm/dl    HCT 41.2 37.0 - 50.0 %    MCV 83.9 80.0 - 98.0 fL    MCH 29.9 25.4 - 34.6 pg    MCHC 35.7 30.0 - 36.0 gm/dl    PLATELET 330 140 - 450 1000/mm3    MPV 9.5 6.0 - 10.0 fL     RDW-SD 38.4  36.4 - 46.3      NRBC 0 0 - 0      IMMATURE GRANULOCYTES 0.2 0.0 - 3.0 %    NEUTROPHILS 51.0 34 - 64 %    LYMPHOCYTES 38.7 28 - 48 %    MONOCYTES 6.0 1 - 13 %    EOSINOPHILS 3.9 0 - 5 %    BASOPHILS 0.2 0 - 3 %   METABOLIC PANEL, COMPREHENSIVE   Result Value Ref Range    Sodium 141 136 - 145 mEq/L    Potassium 3.9 3.5 - 5.1 mEq/L    Chloride 106 98 - 107 mEq/L    CO2 27 21 - 32 mEq/L    Glucose 81 74 - 106 mg/dl    BUN 10 7 - 25 mg/dl    Creatinine 0.8 0.6 - 1.3 mg/dl    GFR est AA >60.0      GFR est non-AA >60      Calcium 8.8 8.5 - 10.1 mg/dl    AST (SGOT) 11 (L) 15 - 37 U/L    ALT (SGPT) 18 12 - 78 U/L    Alk. phosphatase 62 45 - 117 U/L    Bilirubin, total 0.4 0.2 - 1.0 mg/dl    Protein, total 7.0 6.4 - 8.2 gm/dl    Albumin 3.9 3.4 - 5.0 gm/dl   LIPASE   Result Value Ref Range    Lipase 177 73 - 393 U/L   D DIMER   Result Value Ref Range    D DIMER 0.27 0.01 - 0.50 ug/mL (FEU)   POC URINE MACROSCOPIC   Result Value Ref Range    Glucose Negative NEGATIVE,Negative mg/dl    Bilirubin Negative NEGATIVE,Negative      Ketone Negative NEGATIVE,Negative mg/dl    Specific gravity 1.025 1.005 - 1.030      Blood Negative NEGATIVE,Negative      pH (UA) 6.0 5 - 9      Protein 30 (A) NEGATIVE,Negative mg/dl    Urobilinogen 0.2 0.0 - 1.0 EU/dl    Nitrites Negative NEGATIVE,Negative      Leukocyte Esterase Negative NEGATIVE,Negative      Color Yellow      Appearance Clear     POC HCG,URINE   Result Value Ref Range    HCG urine, Ql. negative NEGATIVE,Negative,negative           Study Type: CT ABDOMEN/PELVIS WO  Ordered As: CT ABDOMEN/PELVIS WO  Date of Dictation: 10 Sep 2015 EDT Accession: [id number]  Date of Exam: 10 Sep 2015 EDT Account Number:  Patient ID: [id number] Patient DOB: 1996/04/02  Patient Location: Unknown Caretaker:  Account #: Referring Physician: Annitta Needs  This interpretation is based upon the receipt of 319 images.  Page 1 of 2  EXAM:  CT Abdomen and Pelvis Without Intravenous Contrast   CLINICAL HISTORY:  20 years, female; Pain; Abdominal pain; Localized; Right; Patient HX: Pt C/O right side pain that  began yesterday, radiating into ruq. Pt C/O nausea this morning & lightheadedness at this time. Pain  rated 8/10 at time of triage. . Pt C/O right side pain that began yesterday, radiating into ruq. Pt  C/O nausea this morning & lightheadedness at this time. Pain rated 8/10 at time of triage.  TECHNIQUE:  Axial computed tomography images of the abdomen and pelvis without intravenous contrast.  Coronal and sagittal reformatted images were created and reviewed.  COMPARISON:  No relevant prior studies available.  FINDINGS:  Lower thorax: No acute  findings.  ABDOMEN:  Liver: Unremarkable. No obvious liver masses appreciated on this non-contrast exam.  Gallbladder and bile ducts: Unremarkable. No calcified stones. No significant biliary ductal  dilatation.  Pancreas: Unremarkable. No ductal dilation.  Spleen: Unremarkable. No splenomegaly.  Adrenals: Unremarkable. No adrenal nodules or masses identified.  Kidneys and ureters: There is mild asymmetric prominence of the left renal pelvis. No renal or  ureteral stones identified.  Stomach and bowel: No evidence of bowel obstruction. No significant bowel wall thickening  appreciated.  Appendix: The appendix is unremarkable.  Bladder: Unremarkable. No stones.  Reproductive: Unremarkable as visualized.  ABDOMEN and PELVIS:  Intraperitoneal space: Small amount of free fluid in the pelvis. No free air.  Bones/joints: No acute fracture. No dislocation.  Soft tissues: No significant abnormalities in the superficial soft tissues.  Vasculature: Unremarkable. No abdominal aortic aneurysm.  Lymph nodes: No significant lymph node enlargement.  IMPRESSION:  1. There is mild asymmetric prominence of the left renal pelvis. No renal or ureteral stones identified.  2. Small amount of free fluid in the pelvis.   Thank you for allowing [place] to participate in the care of your patient.  Dictated and Authenticated by: Manya Silvas, MD  09/10/2015 2:08 AM Eilene Ghazi Time ([place] & [place])    Chest x-ray read unremarkable by Dr. Emeterio Reeve    Medical Decision Making/ ED Course   Suspicion for obstruction due to right flank pain that is waxing waning and history of hydronephrosis. Patient does elicit some pleuritic pain with symptoms so a d-dimer was ordered. CT of the abdomen and pelvis was obtained to assess for acute abnormality including obstruction. CT scan shows no acute abnormality of the right flank. Gallbladder is unremarkable. Appendix is unremarkable. Abdomen exam shows no positive Murphy sign. No pain is elicited at McBurney's point. No pain is elicited at suprapubic region. Low suspicion for ovarian cysts abnormality. No indication for pelvic exam at this time. Vital signs are stable and patient is afebrile. Will have her follow-up with PCP in regards to recent ED visit and symptoms.    During the patient's stay in the ER the patient did not develop any new or worsening symptoms and remained stable. Spoke with patient regards to physical exam findings and indication for diagnostic studies. She was made aware of the initial diagnostic results. Patient was offered pain medication in ED to which she initially declined. Due to ongoing pain, patient then asked for medication. She was given IV morphine, Zofran as well as normal saline bolus to which symptoms did improve. Patient was advised of indication to add on CT of the abdomen and pelvis without contrast, d-dimer and chest x-ray. She is made aware of results. Patient was then given Toradol IV in the ED to which symptoms further improved. Patient was advised to follow-up with PCP as soon as possible in regards to recent ED visit to monitor healing. She was advised to return to ED for any symptom worsening or concerns. Patient was given a prescription for  Naprosyn. Patient verbalized understand.  Final Diagnosis       ICD-10-CM ICD-9-CM   1. Right flank pain R10.9 789.09   2. RUQ abdominal pain R10.11 789.01       Disposition     Disposition and plan  Patient was discharged home in stable condition with discharge instructions on the same.     Return to the ER if condition worsens or new symptoms develop.   Follow up with primary care as discussed.  Discharge Medication List as of 09/10/2015  2:15 AM      START taking these medications    Details   naproxen (NAPROSYN) 500 mg tablet Take 1 Tab by mouth two (2) times daily (with meals) for 10 days., Print, Disp-20 Tab, R-0               The patient was personally evaluated by myself and Dr. Emeterio Reeve who agrees with the above assessment and plan.    Dragon medical dictation software was used for portions of this report. Unintended errors may occur.     Hillery Jacks, PA-C  Sep 10, 2015    My signature above authenticates this document and my orders, the final ??  diagnosis (es), discharge prescription (s), and instructions in the Epic ??  record.  If you have any questions please contact [phone].  ??  Nursing notes have been reviewed by the physician/ advanced practice ??  Clinician.    Continued by Dr. Emeterio Reeve    I interviewed and examined the patient. I discussed with the mid-level provider agree with their evaluation and plan as documented here.    Patient complains of pain and right flank that radiates to right upper quadrant. It is located in the lower chest and upper abdomen. She states it is worse with movement and deep breathing and palpation. There is no rash to suggest shingles. Her lungs are clear, and she is not dyspneic. Her abdomen was soft but tender right upper quadrant. There was no lower quadrant tenderness and her abdomen had no rebound or guarding. We considered whether this could be referred pulmonary pain such as from a pneumothorax pneumonia or PE. Her calves are soft and nontender. She is  not hypoxic nor tachycardic. A d-dimer was negative and I interpreted her chest x-ray is no acute findings. Considered whether this could be renal colic, pyelonephritis, biliary colic, right-sided colitis. She had unremarkable urine for infection and a CT scan showing no acute findings. Consider whether this could be muscular since it is worse with palpation and movement. At this time we feel the patient can be discharged home with follow-up with primary care and symptomatic treatment.

## 2015-09-10 NOTE — ED Notes (Signed)
2:37 AM  09/10/15     Discharge instructions given to patient (name) with verbalization of understanding. Patient accompanied by mom.  Patient discharged with the following prescriptions Naprosyn. Patient discharged to home (destination).      Butch PennyKristen M. Gatling, RN

## 2016-03-27 NOTE — Progress Notes (Signed)
Faxed imaging to [facility]

## 2016-04-04 ENCOUNTER — Inpatient Hospital Stay: Admit: 2016-04-04 | Payer: TRICARE (CHAMPUS) | Attending: Urology | Primary: Family Medicine

## 2016-04-04 DIAGNOSIS — N135 Crossing vessel and stricture of ureter without hydronephrosis: Secondary | ICD-10-CM

## 2016-04-04 MED ORDER — FUROSEMIDE 10 MG/ML IJ SOLN
10 mg/mL | Freq: Once | INTRAMUSCULAR | Status: AC
Start: 2016-04-04 — End: 2016-04-04
  Administered 2016-04-04: 19:00:00 via INTRAVENOUS

## 2016-04-04 MED ORDER — TECHNETIUM TC 99M MERTIATIDE
Freq: Once | Status: AC
Start: 2016-04-04 — End: 2016-04-04
  Administered 2016-04-04: 19:00:00 via INTRAVENOUS

## 2016-04-04 MED FILL — FUROSEMIDE 10 MG/ML IJ SOLN: 10 mg/mL | INTRAMUSCULAR | Qty: 2

## 2016-04-04 NOTE — Other (Signed)
Pt seen for lasix injection as part of nuc med study.  Pt tol injection well.  piv patent and flushed without difficulty.   Pt denies any issues.

## 2016-04-04 NOTE — Progress Notes (Signed)
Report is confusing. I reviewed images and curves.  Left kidney is slow to drain initially.  At 18 min lasix given, a few minutes later kidney begins to washout and isotope drops by 50% in less than 10 min.  This is c/w patulous but unobstructed collecting system.  Function is good.  Forward to patient. F/u next available to review and discuss symptoms, etc.    Ruben ReasonJohn B. Sevan Mcbroom, MD  [facility]

## 2016-05-03 ENCOUNTER — Encounter: Attending: Urology | Primary: Family Medicine

## 2016-05-31 ENCOUNTER — Encounter: Attending: Urology | Primary: Family Medicine

## 2016-06-28 ENCOUNTER — Ambulatory Visit
Admit: 2016-06-28 | Discharge: 2016-06-28 | Payer: PRIVATE HEALTH INSURANCE | Attending: Urology | Primary: Family Medicine

## 2016-06-28 DIAGNOSIS — N135 Crossing vessel and stricture of ureter without hydronephrosis: Secondary | ICD-10-CM

## 2016-06-28 LAB — AMB POC URINALYSIS DIP STICK AUTO W/O MICRO
Bilirubin (UA POC): NEGATIVE
Glucose (UA POC): NEGATIVE
Ketones (UA POC): NEGATIVE
Leukocyte esterase (UA POC): NEGATIVE
Nitrites (UA POC): NEGATIVE
Specific gravity (UA POC): 1.03 (ref 1.001–1.035)
Urobilinogen (UA POC): 0.2 (ref 0.2–1)
pH (UA POC): 5.5 (ref 4.6–8.0)

## 2016-06-28 NOTE — Progress Notes (Signed)
Copy of order given to pt to be done near where she goes to school if needed - KJ

## 2016-06-28 NOTE — Progress Notes (Signed)
Kim [name]  1996-04-10    Patient was seen by Dr. Ruben Reason at 9:40 AM on 06/28/2016.     ASSESSMENT:     ICD-10-CM ICD-9-CM    1. Ureteropelvic junction (UPJ) obstruction N13.5 593.4 AMB POC URINALYSIS DIP STICK AUTO W/O MICRO      [place] RETROPERITONEUM COMP      Pyeloplasty 02/2015 for L UPJO.  Doing well.  CT from last year looks great and current lasix renal scan looks good with appropriate post lasix drainage.  She is asymptomatic.    PLAN:    F/U 1 yr with [facility] with RUS.    Chief Complaint   Patient presents with   ??? UPJ Obstruction     Rev Renal Lasix scan     HISTORY OF PRESENT ILLNESS:  Kim [name] is a 21 y.o. Caucasian female who presents in follow up for her left UPJO. She is accompanied by her mother today. She is doing well today. She does not have any new complaints. She is currently asymptomatic. No dysuria, gross hematuria, abdominal pain, or flank pain. She is currently on her menses.      Review of Systems  Constitutional: Fever: No  Skin: Rash: No  HEENT: Hearing difficulty: No  Eyes: Blurred vision: No  Cardiovascular: Chest pain: No  Respiratory: Shortness of breath: No  Gastrointestinal: Nausea/vomiting: No  Musculoskeletal: Back pain: Yes  left flank   Neurological: Weakness: No  Psychological: Memory loss: No  Comments/additional findings:     Past Medical History:   Diagnosis Date   ??? Chronic kidney disease     congenital left hydronephrosis   ??? Hydronephrosis     congenital left side   ??? Reactive airway disease      Past Surgical History:   Procedure Laterality Date   ??? HX TONSIL AND ADENOIDECTOMY       Social History   Substance Use Topics   ??? Smoking status: Never Smoker   ??? Smokeless tobacco: Never Used   ??? Alcohol use No     No Known Allergies    Family History   Problem Relation Age of Onset   ??? Cancer Paternal Grandmother    ??? Cancer Paternal Grandfather      PHYSICAL EXAMINATION:   Visit Vitals   ??? BP 110/62   ??? Ht 5\' 11"  (1.803 m)   ??? Wt 165 lb (74.8 kg)    ??? BMI 23.01 kg/m2     GEN: NAD, alert and oriented  PULM: nl resp effort, no distress  EXT: well perfused  PSYCH: Nl mood and affect     REVIEW OF LABS AND IMAGING:    Results for orders placed or performed in visit on 06/28/16   AMB POC URINALYSIS DIP STICK AUTO W/O MICRO   Result Value Ref Range    Color (UA POC) Yellow     Clarity (UA POC) Clear     Glucose (UA POC) Negative Negative    Bilirubin (UA POC) Negative Negative    Ketones (UA POC) Negative Negative    Specific gravity (UA POC) 1.030 1.001 - 1.035    Blood (UA POC) 3+ Negative    pH (UA POC) 5.5 4.6 - 8.0    Protein (UA POC) 1+ Negative    Urobilinogen (UA POC) 0.2 mg/dL 0.2 - 1    Nitrites (UA POC) Negative Negative    Leukocyte esterase (UA POC) Negative Negative     Lasix Renal Scan (04/04/2016):  FINDINGS:  Aorta kidney time vascular phase left kidney greater than 3 seconds.  Right kidney 2 seconds. Normal less than 3 seconds. Transit time T MAX left  kidney 26 minutes 30 seconds. Right 6 minutes. Normal less than 5 minutes.     Expiratory phase T1 half max left kidney 96 minutes. Right kidney 14 minutes 30  seconds. Normal less than 10 minutes. T1 half time post Lasix injection 69  minutes 15 seconds left kidney. Right kidney 3 minutes 15 seconds. Normal less  than 15 minutes.     Percent excretion at 10 minutes not applicable for the left kidney and 14.6% for  the right kidney. Relative renal uptake 46% for left kidney and 54% for the  right kidney. Time/activity curves generated. The right kidney shows slightly  delayed vascular and excretory phase. The left kidney shows persistent activity  which keeps increasing until the patient was given Lasix and then shortly  afterwards the amount of activity decrease. This shows improvement in function  of the left kidney compared to previous examination.     IMPRESSION:  1. Obstruction of the left kidney which after administration of Lasix shows good   emptying of the left renal collecting system indicating improvement from the  previous examination.  2. Right kidney demonstrates mild delay and moderate decreased excretion similar  to previous study. Normal washout with Lasix. No obstruction     CT Abdomen and Pelvis without Contrast (09/10/2015):   FINDINGS:    LOWER THORAX: No focal infiltrate, pleural effusion, or pericardial effusion.        ABDOMEN: There is a small amount of dependent pelvic free fluid, likely  physiologic. There is no free air. There is no focal hepatic or splenic lesion.  There are no radiopaque gallstones. The adrenal glands and pancreas are  unremarkable. There is slight malrotation of the left kidney. There is  asymmetric prominence of the left renal pelvis which may be due to chronic UPJ  obstruction or an extrarenal pelvis. No obstructing calculus. Findings are  improved when compared to prior CT.     PELVIS: There is no focal bladder wall thickening. The uterus is present. No  dilated loops of bowel to suggest obstruction the appendix is normal. Negative  for aortic dissection. No significant lymphadenopathy.     OSSEOUS STRUCTURES: Acutely intact.     IMPRESSION:  1.  Negative for bowel obstruction.  2.  Slight malrotation of left kidney with prominent left renal pelvis either  due to chronic UPJ obstruction or an extra renal pelvis. Findings are improved  when compared to the prior exam.    Imaging Report Reviewed? Yes - Lasix renal scan  Images Reviewed? Yes - Lasix renal scan    Labs Reviewed? Yes - UA    Ruben ReasonJohn B. Beth Goodlin, MD  [facility]    CC: Benay Spiceavid V Palmisano, MD    Documentation was provided with the assistance of Jake MichaelisMichelle Li, medical scribe for Ruben ReasonJohn B Isobelle Tuckett, MD on 06/28/2016.

## 2016-07-05 ENCOUNTER — Encounter: Attending: Urology | Primary: Family Medicine

## 2017-02-27 NOTE — Progress Notes (Signed)
Per patient request faxed order to [facility] for US at [phone].

## 2017-02-27 NOTE — Telephone Encounter (Signed)
Patient's mother Lanice SchwabDiane Vantol (HIPAA verified) called checking [facility]. I informed her it was faxed over today.

## 2017-03-03 NOTE — Progress Notes (Signed)
Patient called me last week requesting I [facility] which I did on 02/28/17.  Patient said she needed to get this done before appointment on 03/31/17.  Stanton KidneyDebra from [facility] called her today and patient told her she is only available on 03/04/17 which is tomorrow and declined other times offered.

## 2017-03-05 ENCOUNTER — Encounter: Attending: Physician Assistant | Primary: Family Medicine

## 2017-03-27 ENCOUNTER — Ambulatory Visit: Admit: 2017-03-27 | Discharge: 2017-03-27 | Payer: PRIVATE HEALTH INSURANCE | Primary: Family Medicine

## 2017-03-27 DIAGNOSIS — N135 Crossing vessel and stricture of ureter without hydronephrosis: Secondary | ICD-10-CM

## 2017-03-27 NOTE — Progress Notes (Signed)
Looks good  Fwd to pt  F/u as scheduled

## 2017-03-27 NOTE — Progress Notes (Signed)
Renal ultrasound done per office protocol.

## 2017-03-31 ENCOUNTER — Ambulatory Visit
Admit: 2017-03-31 | Discharge: 2017-03-31 | Payer: PRIVATE HEALTH INSURANCE | Attending: Physician Assistant | Primary: Family Medicine

## 2017-03-31 DIAGNOSIS — N135 Crossing vessel and stricture of ureter without hydronephrosis: Secondary | ICD-10-CM

## 2017-03-31 LAB — AMB POC URINALYSIS DIP STICK AUTO W/O MICRO
Bilirubin (UA POC): NEGATIVE
Blood (UA POC): NEGATIVE
Glucose (UA POC): NEGATIVE
Ketones (UA POC): NEGATIVE
Leukocyte esterase (UA POC): NEGATIVE
Nitrites (UA POC): NEGATIVE
Protein (UA POC): NEGATIVE
Specific gravity (UA POC): 1.02 (ref 1.001–1.035)
Urobilinogen (UA POC): 0.2 (ref 0.2–1)
pH (UA POC): 6 (ref 4.6–8.0)

## 2017-03-31 NOTE — Progress Notes (Signed)
Kim BorrowClaire [name]  08/31/1995    ASSESSMENT:     ICD-10-CM ICD-9-CM    1. Ureteropelvic junction (UPJ) obstruction N13.5 593.4 AMB POC URINALYSIS DIP STICK AUTO W/O MICRO      NM RENAL SCAN FLOW/FUNC W PHARM SNGL      Pyeloplasty 02/2015 for L UPJO.  Doing well.  CT from last year looks great and recent lasix renal scan looks good with appropriate post lasix drainage. Recent RUS 03/27/17 negative for hydro  She is asymptomatic.    PLAN:    Review RUS- copy provided for her records. Reviewed by Dr. Judie PetitMalcolm. Looks good  Will continue to monitor annually for now  Contact office with any issues or concerns  FU 1 year with lasix renal scan    I am following the established plan for Dr. Judie PetitMalcolm and Dr. Daphine DeutscherMartin is the supervising physician for this day.    Chief Complaint   Patient presents with   ??? UPJ Obstruction     HISTORY OF PRESENT ILLNESS:  Kim BorrowClaire [name] is a 21 y.o. Caucasian female who presents in follow up for her left UPJO. She is doing well today. She does not have any new complaints. She is currently asymptomatic. No dysuria, gross hematuria, abdominal pain, or flank pain. Reports occasionally left sided low back pain but none currently. Not associated with fluid intake.    Review of Systems  Constitutional: Fever: No  Skin: Rash: No  HEENT: Hearing difficulty: No  Eyes: Blurred vision: No  Cardiovascular: Chest pain: No  Respiratory: Shortness of breath: No  Gastrointestinal: Nausea/vomiting: No  Musculoskeletal: Back pain: No  Neurological: Weakness: No  Psychological: Memory loss: No  Comments/additional findings:     Past Medical History:   Diagnosis Date   ??? Chronic kidney disease     congenital left hydronephrosis   ??? Hydronephrosis     congenital left side   ??? Reactive airway disease      Past Surgical History:   Procedure Laterality Date   ??? HX OTHER SURGICAL      "clean out" bilaterally   ??? HX TONSIL AND ADENOIDECTOMY       Social History     Tobacco Use   ??? Smoking status: Never Smoker    ??? Smokeless tobacco: Never Used   Substance Use Topics   ??? Alcohol use: No   ??? Drug use: No     No Known Allergies    Family History   Problem Relation Age of Onset   ??? Cancer Paternal Grandmother    ??? Cancer Paternal Grandfather      PHYSICAL EXAMINATION:   Visit Vitals  BP 126/78 (BP 1 Location: Right arm, BP Patient Position: Sitting)   Ht 5\' 11"  (1.803 m)   Wt 165 lb (74.8 kg)   BMI 23.01 kg/m??      GEN: NAD, alert and oriented  PULM: nl resp effort, no distress  BACK: No CVAT bilaterally  EXT: well perfused  PSYCH: Nl mood and affect     REVIEW OF LABS AND IMAGING:    Results for orders placed or performed in visit on 03/31/17   AMB POC URINALYSIS DIP STICK AUTO W/O MICRO   Result Value Ref Range    Color (UA POC) Yellow     Clarity (UA POC) Clear     Glucose (UA POC) Negative Negative    Bilirubin (UA POC) Negative Negative    Ketones (UA POC) Negative Negative    Specific gravity (UA  POC) 1.020 1.001 - 1.035    Blood (UA POC) Negative Negative    pH (UA POC) 6.0 4.6 - 8.0    Protein (UA POC) Negative Negative    Urobilinogen (UA POC) 0.2 mg/dL 0.2 - 1    Nitrites (UA POC) Negative Negative    Leukocyte esterase (UA POC) Negative Negative     Renal US (03/27/2017)  Impression:   Left limited views due to bowel gas. No apparent hydronephrosis.  Right mild caliectasis superior.  Non-fully distended bladder.    Lasix Renal Scan (04/04/2016):  IMPRESSION:  1. Obstruction of the left kidney which after administration of Lasix shows good  emptying of the left renal collecting system indicating improvement from the  previous examination.  2. Right kidney demonstrates mild delay and moderate decreased excretion similar  to previous study. Normal washout with Lasix. No obstruction    Imaging Report Reviewed? Yes - RUS  Images Reviewed? No    Labs Reviewed? Yes - UA    Vickki MuffBrooke Junelle Hashemi PA-C  [facility]    CC: Kris HartmannPalmisano, Oran Reinavid V, MD

## 2017-04-01 NOTE — Progress Notes (Signed)
Imaging placed in future folder.  Imaging will be sent out 1 month prior to f/u appt unless noted otherwise; Renal Scan; F/U 03/27/18

## 2017-05-31 ENCOUNTER — Other Ambulatory Visit: Payer: Self-pay

## 2017-05-31 ENCOUNTER — Encounter: Payer: Self-pay | Admitting: Emergency Medicine

## 2017-05-31 ENCOUNTER — Emergency Department

## 2017-05-31 DIAGNOSIS — R079 Chest pain, unspecified: Secondary | ICD-10-CM | POA: Insufficient documentation

## 2017-05-31 DIAGNOSIS — R0602 Shortness of breath: Secondary | ICD-10-CM | POA: Diagnosis present

## 2017-05-31 DIAGNOSIS — J209 Acute bronchitis, unspecified: Secondary | ICD-10-CM | POA: Diagnosis not present

## 2017-05-31 NOTE — ED Triage Notes (Signed)
Pt to triage via WC, reports finished a softball game and started feeling SOB, hyperventilating.  Mother reports pt hx of narrow airway and whenever she gets sick gets "croupy".  Pt coached to slow breathing.  Breathing improved with coaching.  VSS, o2sat 100%.

## 2017-06-01 ENCOUNTER — Emergency Department
Admission: EM | Admit: 2017-06-01 | Discharge: 2017-06-01 | Disposition: A | Discharge status: Home / Self Care | Attending: Emergency Medicine | Admitting: Emergency Medicine

## 2017-06-01 DIAGNOSIS — R05 Cough: Secondary | ICD-10-CM

## 2017-06-01 DIAGNOSIS — R059 Cough, unspecified: Secondary | ICD-10-CM

## 2017-06-01 DIAGNOSIS — J209 Acute bronchitis, unspecified: Secondary | ICD-10-CM | POA: Diagnosis not present

## 2017-06-01 DIAGNOSIS — R079 Chest pain, unspecified: Secondary | ICD-10-CM

## 2017-06-01 DIAGNOSIS — J40 Bronchitis, not specified as acute or chronic: Secondary | ICD-10-CM

## 2017-06-01 LAB — BASIC METABOLIC PANEL
Anion gap: 11 (ref 5–15)
BUN: 11 mg/dL (ref 6–20)
CHLORIDE: 106 mmol/L (ref 101–111)
CO2: 20 mmol/L — ABNORMAL LOW (ref 22–32)
Calcium: 8.9 mg/dL (ref 8.9–10.3)
Creatinine, Ser: 0.66 mg/dL (ref 0.44–1.00)
GFR calc non Af Amer: >60 mL/min (ref >60)
Glucose, Bld: 80 mg/dL (ref 65–99)
POTASSIUM: 3.5 mmol/L (ref 3.5–5.1)
SODIUM: 137 mmol/L (ref 135–145)

## 2017-06-01 LAB — CBC WITH DIFFERENTIAL/PLATELET
BASOS PCT: 0 %
Basophils Absolute: 0 10*3/uL (ref 0–0.1)
EOS ABS: 0.3 10*3/uL (ref 0–0.7)
Eosinophils Relative: 2 %
HCT: 37.8 % (ref 35.0–47.0)
HEMOGLOBIN: 13.3 g/dL (ref 12.0–16.0)
LYMPHS ABS: 2.4 10*3/uL (ref 1.0–3.6)
Lymphocytes Relative: 21 %
MCH: 30.2 pg (ref 26.0–34.0)
MCHC: 35.2 g/dL (ref 32.0–36.0)
MCV: 85.8 fL (ref 80.0–100.0)
Monocytes Absolute: 0.9 10*3/uL (ref 0.2–0.9)
Monocytes Relative: 8 %
NEUTROS PCT: 69 %
Neutro Abs: 7.8 10*3/uL — ABNORMAL HIGH (ref 1.4–6.5)
Platelets: 320 10*3/uL (ref 150–440)
RBC: 4.41 MIL/uL (ref 3.80–5.20)
RDW: 13.2 % (ref 11.5–14.5)
WBC: 11.5 10*3/uL — AB (ref 3.6–11.0)

## 2017-06-01 LAB — FIBRIN DERIVATIVES D-DIMER (ARMC ONLY): FIBRIN DERIVATIVES D-DIMER (ARMC): 263.3 ng{FEU}/mL (ref 0.00–499.00)

## 2017-06-01 LAB — TROPONIN I

## 2017-06-01 MED ORDER — HYDROCOD POLST-CPM POLST ER 10-8 MG/5ML PO SUER
5.0000 mL | Freq: Once | ORAL | Status: AC
Start: 2017-06-01 — End: 2017-06-01
  Administered 2017-06-01: 5 mL via ORAL
  Filled 2017-06-01: qty 5

## 2017-06-01 MED ORDER — ALBUTEROL SULFATE HFA 108 (90 BASE) MCG/ACT IN AERS: 2.0000 | INHALATION_SPRAY | RESPIRATORY_TRACT | 0 refills | Status: AC | PRN

## 2017-06-01 MED ORDER — DEXAMETHASONE SODIUM PHOSPHATE 10 MG/ML IJ SOLN
10.0000 mg | Freq: Once | INTRAMUSCULAR | Status: AC
  Administered 2017-06-01: 10 mg via INTRAVENOUS
  Filled 2017-06-01: qty 1

## 2017-06-01 MED ORDER — SODIUM CHLORIDE 0.9 % IV BOLUS (SEPSIS)
1000.0000 mL | Freq: Once | INTRAVENOUS | Status: AC
  Administered 2017-06-01: 1000 mL via INTRAVENOUS

## 2017-06-01 MED ORDER — HYDROCOD POLST-CPM POLST ER 10-8 MG/5ML PO SUER: 5.0000 mL | Freq: Two times a day (BID) | ORAL | 0 refills | Status: AC

## 2017-06-01 MED ORDER — KETOROLAC TROMETHAMINE 30 MG/ML IJ SOLN
10.0000 mg | Freq: Once | INTRAMUSCULAR | Status: AC
  Administered 2017-06-01: 9.9 mg via INTRAVENOUS
  Filled 2017-06-01: qty 1

## 2017-06-01 NOTE — Discharge Instructions (Signed)
1.  You may take cough medicine as needed (Tussionex). 2.  Take Tylenol and ibuprofen as needed for discomfort. 3.  Use albuterol inhaler 2 puffs every 4 hours as needed for cough/wheezing/difficulty breathing. 4.  Return to the ER for worsening symptoms, persistent vomiting, difficulty breathing or other concerns.

## 2017-06-01 NOTE — ED Provider Notes (Signed)
[facility] Emergency Department Provider Note   ____________________________________________   First MD Initiated Contact with Patient 06/01/17 [phone]     (approximate)  I have reviewed the triage vital signs and the nursing notes.   HISTORY  Chief Complaint Shortness of Breath    HPI Diamond Caldwell is a 22 y.o. female who presents to the ED from home with a chief complaint of cough, chest pain, shortness of breath and hyperventilating.  Patient has been sick for the past week with cough productive of yellow sputum, and body aches.  Last night she pitched an entire softball game.  After the game patient began to feel short of breath and presented to the ED hyperventilating.  Mother reports history of "narrow airway" and states that patient gets "croupy" whenever she gets sick.  Patient denies associated fever, chills, abdominal pain, nausea, vomiting, diarrhea.  Denies recent trauma.  Flight last week to [place].  No OCP use.   Past medical history None  There are no active problems to display for this patient.   History reviewed. No pertinent surgical history.  Prior to Admission medications   Medication Sig Start Date End Date Taking? Authorizing Provider  albuterol (PROVENTIL HFA;VENTOLIN HFA) 108 (90 Base) MCG/ACT inhaler Inhale 2 puffs into the lungs every 4 (four) hours as needed for wheezing or shortness of breath. 06/01/17   Irean Hong, MD  chlorpheniramine-HYDROcodone [facility] PENNKINETIC ER) 10-8 MG/5ML SUER Take 5 mLs by mouth 2 (two) times daily. 06/01/17   Irean Hong, MD    Allergies Patient has no known allergies.  History reviewed. No pertinent family history.  Social History Social History   Tobacco Use  . Smoking status: Never Smoker  . Smokeless tobacco: Never Used  Substance Use Topics  . Alcohol use: No    Frequency: Never  . Drug use: Not on file    Review of Systems  Constitutional: No fever/chills. Eyes: No  visual changes. ENT: No sore throat. Cardiovascular: Positive for chest pain. Respiratory: Positive for cough and shortness of breath. Gastrointestinal: No abdominal pain.  No nausea, no vomiting.  No diarrhea.  No constipation. Genitourinary: Negative for dysuria. Musculoskeletal: Negative for back pain. Skin: Negative for rash. Neurological: Negative for headaches, focal weakness or numbness.   ____________________________________________   PHYSICAL EXAM:  VITAL SIGNS: ED Triage Vitals [05/31/17 2149]  Enc Vitals Group     BP 138/80     Pulse Rate 87     Resp (!) 30     Temp 98.2 F (36.8 C)     Temp Source Oral     SpO2 100 %     Weight 170 lb (77.1 kg)     Height 5\' 11"  (1.803 m)     Head Circumference      Peak Flow      Pain Score 8     Pain Loc      Pain Edu?      Excl. in GC?     Constitutional: Alert and oriented. Well appearing and in mild acute distress. Eyes: Conjunctivae are normal. PERRL. EOMI. Head: Atraumatic. Nose: No congestion/rhinnorhea. Mouth/Throat: Mucous membranes are moist.  Oropharynx non-erythematous. Neck: No stridor.   Cardiovascular: Normal rate, regular rhythm. Grossly normal heart sounds.  Good peripheral circulation. Respiratory: Normal respiratory effort.  No retractions. Lungs CTAB.  Bronchitic sounding cough.  Anterior chest wall tender to palpation. Gastrointestinal: Soft and nontender. No distention. No abdominal bruits. No CVA tenderness. Musculoskeletal: No  lower extremity tenderness nor edema.  No joint effusions. Neurologic:  Normal speech and language. No gross focal neurologic deficits are appreciated. No gait instability. Skin:  Skin is warm, dry and intact. No rash noted.  No petechiae. Psychiatric: Mood and affect are slightly anxious. Speech and behavior are normal.  ____________________________________________   LABS (all labs ordered are listed, but only abnormal results are displayed)  Labs Reviewed  CBC WITH  DIFFERENTIAL/PLATELET - Abnormal; Notable for the following components:      Result Value   WBC 11.5 (*)    Neutro Abs 7.8 (*)    All other components within normal limits  BASIC METABOLIC PANEL - Abnormal; Notable for the following components:   CO2 20 (*)    All other components within normal limits  TROPONIN I  FIBRIN DERIVATIVES D-DIMER ([facility] ONLY)   ____________________________________________  EKG  ED ECG REPORT I, Davon Abdelaziz J, the attending physician, personally viewed and interpreted this ECG.   Date: 06/01/2017  EKG Time: 2155  Rate: 86  Rhythm: normal EKG, normal sinus rhythm  Axis: Normal  Intervals:none  ST&T Change: Nonspecific  ____________________________________________  RADIOLOGY  ED MD interpretation: No pneumonia  Official radiology report(s): Dg Chest 2 View  Result Date: 05/31/2017 CLINICAL DATA:  Dyspnea and hyperventilating after softball game. EXAM: CHEST  2 VIEW COMPARISON:  None. FINDINGS: The heart size and mediastinal contours are within normal limits. Both lungs are clear. The visualized skeletal structures are unremarkable. IMPRESSION: No active cardiopulmonary disease. Electronically Signed   By: Tollie Eth M.D.   On: 05/31/2017 22:54    ____________________________________________   PROCEDURES  Procedure(s) performed: None  Procedures  Critical Care performed: No  ____________________________________________   INITIAL IMPRESSION / ASSESSMENT AND PLAN / ED COURSE  As part of my medical decision making, I reviewed the following data within the electronic MEDICAL RECORD NUMBER History obtained from family, Nursing notes reviewed and incorporated, Labs reviewed, EKG interpreted, Old chart reviewed, Radiograph reviewed and Notes from prior ED visits.   22 year old female who presents with productive cough, chest pain and shortness of breath after pitching an entire softball game. Differential includes, but is not limited to, viral  syndrome, bronchitis including COPD exacerbation, pneumonia, reactive airway disease including asthma, CHF including exacerbation with or without pulmonary/interstitial edema, pneumothorax, ACS, thoracic trauma, and pulmonary embolism.   Chest x-ray negative for pneumonia.  EKG unremarkable.  Most likely patient is having reproducible chest wall pain secondary to bronchitis.  However, given patient's recent travel and her complaints, will check blood work including d-dimer.  Administer Decadron, Toradol, Tussionex for symptomatic relief of symptoms.   Clinical Course as of Jun 01 344  Sun Jun 01, 2017  1610 Patient feeling much better.  Updated patient and mother of unremarkable laboratory results including normal troponin and d-dimer.  Strict return precautions given.  Both verbalize understanding and agree with plan of care.  [JS]    Clinical Course User Index [JS] Irean Hong, MD     ____________________________________________   FINAL CLINICAL IMPRESSION(S) / ED DIAGNOSES  Final diagnoses:  Bronchitis  Cough  Chest pain, unspecified type     ED Discharge Orders        Ordered    albuterol (PROVENTIL HFA;VENTOLIN HFA) 108 (90 Base) MCG/ACT inhaler  Every 4 hours PRN     06/01/17 0344    chlorpheniramine-HYDROcodone (TUSSIONEX PENNKINETIC ER) 10-8 MG/5ML SUER  2 times daily     06/01/17 0344  Note:  This document was prepared using Dragon voice recognition software and may include unintentional dictation errors.    Irean HongSung, Torry Adamczak J, MD 06/01/17 [phone]

## 2017-06-01 NOTE — ED Notes (Signed)
ED Provider at bedside. 

## 2017-09-01 DIAGNOSIS — R1031 Right lower quadrant pain: Secondary | ICD-10-CM

## 2017-09-01 NOTE — ED Notes (Signed)
Lower abd pain that radiates into lower back for the past couple of hours.  Denies any n/v

## 2017-09-01 NOTE — ED Triage Notes (Signed)
Lower abd pain that radiates into lower back for the past couple of hours.  Denies any n/v

## 2017-09-02 ENCOUNTER — Emergency Department

## 2017-09-02 ENCOUNTER — Inpatient Hospital Stay
Admit: 2017-09-02 | Discharge: 2017-09-02 | Disposition: A | Discharge status: Home / Self Care | Payer: TRICARE (CHAMPUS) | Attending: Emergency Medicine

## 2017-09-02 ENCOUNTER — Emergency Department: Admit: 2017-09-03 | Payer: TRICARE (CHAMPUS) | Primary: Family Medicine

## 2017-09-02 ENCOUNTER — Inpatient Hospital Stay
Admit: 2017-09-02 | Discharge: 2017-09-03 | Disposition: A | Discharge status: Home / Self Care | Payer: TRICARE (CHAMPUS) | Attending: Emergency Medicine

## 2017-09-02 ENCOUNTER — Emergency Department: Admit: 2017-09-02 | Payer: TRICARE (CHAMPUS) | Primary: Family Medicine

## 2017-09-02 DIAGNOSIS — N83209 Unspecified ovarian cyst, unspecified side: Secondary | ICD-10-CM

## 2017-09-02 LAB — CBC WITH AUTOMATED DIFF
BASOPHILS: 0.4 % (ref 0–3)
EOSINOPHILS: 3.5 % (ref 0–5)
HCT: 41.2 % (ref 37.0–50.0)
HGB: 14.2 gm/dl (ref 13.0–17.2)
IMMATURE GRANULOCYTES: 0.4 % (ref 0.0–3.0)
LYMPHOCYTES: 36.7 % (ref 28–48)
MCH: 30.1 pg (ref 25.4–34.6)
MCHC: 34.5 gm/dl (ref 30.0–36.0)
MCV: 87.5 fL (ref 80.0–98.0)
MONOCYTES: 7.5 % (ref 1–13)
MPV: 9.9 fL (ref 6.0–10.0)
NEUTROPHILS: 51.5 % (ref 34–64)
NRBC: 0 (ref 0–0)
PLATELET: 317 10*3/uL (ref 140–450)
RBC: 4.71 M/uL (ref 3.60–5.20)
RDW-SD: 41.1 (ref 36.4–46.3)
WBC: 9.7 10*3/uL (ref 4.0–11.0)

## 2017-09-02 LAB — METABOLIC PANEL, BASIC
Anion gap: 7 mmol/L (ref 5–15)
BUN: 11 mg/dl (ref 7–25)
CO2: 25 mEq/L (ref 21–32)
Calcium: 8.9 mg/dl (ref 8.5–10.1)
Chloride: 106 mEq/L (ref 98–107)
Creatinine: 1 mg/dl (ref 0.6–1.3)
GFR est AA: >60
GFR est non-AA: >60
Glucose: 83 mg/dl (ref 74–106)
Potassium: 3.6 mEq/L (ref 3.5–5.1)
Sodium: 138 mEq/L (ref 136–145)

## 2017-09-02 LAB — POC URINE MACROSCOPIC
Bilirubin, Urine: NEGATIVE
Bilirubin: NEGATIVE
Blood, Urine: NEGATIVE
Blood: NEGATIVE
Glucose, Ur: NEGATIVE mg/dl
Glucose: NEGATIVE mg/dl
Ketone: NEGATIVE mg/dl
Ketones, Urine: NEGATIVE mg/dl
Leukocyte Esterase, Urine: NEGATIVE
Leukocyte Esterase: NEGATIVE
Nitrite, Urine: NEGATIVE
Nitrites: NEGATIVE
Protein, UA: NEGATIVE mg/dl
Protein: NEGATIVE mg/dl
Specific Gravity, UA: <=1.005 (ref 1.005–1.030)
Specific gravity: <=1.005 (ref 1.005–1.030)
Urobilinogen, UA, POCT: 0.2 EU/dl (ref 0.0–1.0)
Urobilinogen: 0.2 EU/dl (ref 0.0–1.0)
pH (UA): 7 (ref 5–9)
pH, UA: 7 (ref 5–9)

## 2017-09-02 LAB — HEPATIC FUNCTION PANEL
ALT (SGPT): 19 U/L (ref 12–78)
ALT: 19 U/L (ref 12–78)
AST (SGOT): 15 U/L (ref 15–37)
AST: 15 U/L (ref 15–37)
Albumin: 4.1 gm/dl (ref 3.4–5.0)
Albumin: 4.1 gm/dl (ref 3.4–5.0)
Alk. phosphatase: 55 U/L (ref 45–117)
Alkaline Phosphatase: 55 U/L (ref 45–117)
Bilirubin, Direct: 0.1 mg/dl (ref 0.0–0.2)
Bilirubin, direct: 0.1 mg/dl (ref 0.0–0.2)
Bilirubin, total: 0.4 mg/dl (ref 0.2–1.0)
Protein, total: 7.3 gm/dl (ref 6.4–8.2)
Total Bilirubin: 0.4 mg/dl (ref 0.2–1.0)
Total Protein: 7.3 gm/dl (ref 6.4–8.2)

## 2017-09-02 LAB — LIPASE
Lipase: 222 U/L (ref 73–393)
Lipase: 222 U/L (ref 73–393)

## 2017-09-02 LAB — POC HCG,URINE
HCG urine, QL: NEGATIVE
Pregnancy Test(Urn): NEGATIVE

## 2017-09-02 LAB — CBC WITH AUTO DIFFERENTIAL
Basophils %: 0.4 % (ref 0–3)
Eosinophils %: 3.5 % (ref 0–5)
Hematocrit: 41.2 % (ref 37.0–50.0)
Hemoglobin: 14.2 gm/dl (ref 13.0–17.2)
Immature Granulocytes: 0.4 % (ref 0.0–3.0)
Lymphocytes %: 36.7 % (ref 28–48)
MCH: 30.1 pg (ref 25.4–34.6)
MCHC: 34.5 gm/dl (ref 30.0–36.0)
MCV: 87.5 fL (ref 80.0–98.0)
MPV: 9.9 fL (ref 6.0–10.0)
Monocytes %: 7.5 % (ref 1–13)
Neutrophils %: 51.5 % (ref 34–64)
Nucleated RBCs: 0 (ref 0–0)
Platelets: 317 10*3/uL (ref 140–450)
RBC: 4.71 M/uL (ref 3.60–5.20)
RDW-SD: 41.1 (ref 36.4–46.3)
WBC: 9.7 10*3/uL (ref 4.0–11.0)

## 2017-09-02 LAB — BASIC METABOLIC PANEL
Anion Gap: 7 mmol/L (ref 5–15)
BUN: 11 mg/dl (ref 7–25)
CO2: 25 mEq/L (ref 21–32)
Calcium: 8.9 mg/dl (ref 8.5–10.1)
Chloride: 106 mEq/L (ref 98–107)
Creatinine: 1 mg/dl (ref 0.6–1.3)
EGFR IF NonAfrican American: >60
GFR African American: >60
Glucose: 83 mg/dl (ref 74–106)
Potassium: 3.6 mEq/L (ref 3.5–5.1)
Sodium: 138 mEq/L (ref 136–145)

## 2017-09-02 MED ORDER — ONDANSETRON (PF) 4 MG/2 ML INJECTION
4 mg/2 mL | Freq: Once | INTRAMUSCULAR | Status: AC
Start: 2017-09-02 — End: 2017-09-02
  Administered 2017-09-02: 09:00:00 via INTRAVENOUS

## 2017-09-02 MED ORDER — IOPAMIDOL 76 % IV SOLN
370 mg iodine /mL (76 %) | Freq: Once | INTRAVENOUS | Status: AC
Start: 2017-09-02 — End: 2017-09-02
  Administered 2017-09-02: 09:00:00 via INTRAVENOUS

## 2017-09-02 MED ORDER — SODIUM CHLORIDE 0.9 % IJ SYRG
Freq: Once | INTRAMUSCULAR | Status: DC
Start: 2017-09-02 — End: 2017-09-02

## 2017-09-02 MED ORDER — DIATRIZOATE MEGLUMINE & SODIUM 66 %-10 % ORAL SOLN
66-10 % | Freq: Once | ORAL | Status: DC
Start: 2017-09-02 — End: 2017-09-02

## 2017-09-02 MED ORDER — SODIUM CHLORIDE 0.9% BOLUS IV
0.9 % | INTRAVENOUS | Status: AC
Start: 2017-09-02 — End: 2017-09-02
  Administered 2017-09-02: 09:00:00 via INTRAVENOUS

## 2017-09-02 MED ORDER — MORPHINE 4 MG/ML SYRINGE
4 mg/mL | INTRAMUSCULAR | Status: AC
Start: 2017-09-02 — End: 2017-09-02
  Administered 2017-09-02: 09:00:00 via INTRAVENOUS

## 2017-09-02 MED FILL — MORPHINE 4 MG/ML SYRINGE: 4 mg/mL | INTRAMUSCULAR | Qty: 1

## 2017-09-02 MED FILL — ISOVUE-370  76 % INTRAVENOUS SOLUTION: 370 mg iodine /mL (76 %) | INTRAVENOUS | Qty: 80

## 2017-09-02 MED FILL — ONDANSETRON (PF) 4 MG/2 ML INJECTION: 4 mg/2 mL | INTRAMUSCULAR | Qty: 2

## 2017-09-02 NOTE — ED Notes (Signed)
6:51 AM  09/02/17     Discharge instructions given to patient (name) with verbalization of understanding. Patient accompanied by family.  Patient discharged with the following prescriptions none. Patient discharged to home (destination).      Adele Dan, RN

## 2017-09-02 NOTE — ED Notes (Signed)
Pt finished Gastro fluids. Called Ct.  Estimated call time 2330

## 2017-09-02 NOTE — ED Provider Notes (Signed)
ED Provider Notes by Antoine Primas, PA-C at 09/02/17 2207                Author: Antoine Primas, PA-C  Service: EMERGENCY  Author Type: Physician Assistant       Filed: 09/03/17 0202  Date of Service: 09/02/17 2207  Status: Attested           Editor: Antoine Primas, PA-C (Physician Assistant)  Cosigner: Helaine Chess, MD at 09/05/17 1429          Attestation signed by Helaine Chess, MD at 09/05/17 1429          I, Helaine Chess, MD have discussed this case  with the advanced practice provider. We have reviewed the presentation, pertinent historical and physical exam findings, as well as relevant laboratory/radiology findings. I have been available at all times and agree with the management and disposition  documented here.       Helaine Chess, MD   Sep 05, 2017                                       [facility]   Emergency Department Treatment Report          Patient: Kim [name]  Age: 22 y.o.  Sex: female          Date of Birth: January 17, 1996  Admit Date: 09/02/2017  PCP: Dulce Sellar, MD     MRN: [id number]   CSN: [id number]   Attending: Helaine Chess, MD         Room: 109/EO09  Time Dictated: 10:07 PM  APP: Antoine Primas, PA-C        Chief Complaint      Chief Complaint       Patient presents with        ?  Abdominal Pain             here last night as well              History of Present Illness     22 y.o. female  returns to ED for RLQ abdominal pain.  Pain started abruptly Monday evening around 2000.  Pain is constant but waxes and wanes in intensity.  Pain is "pulsating".  Pain radiates around to right low back.  Evaluated in this ED earlier this morning for  the same.  Abdominal CT could not fully visualize her appendix so she was instructed to return in 12 hours if pain persisted.  She has been taking Tylenol and ibuprofen throughout the day and pain has gradually been worsening.  She also had slight nausea  earlier.  No vomiting.  No diarrhea.  No  voiding symptoms.  No fevers or chills.  Last took Tylenol at 1600.        Review of Systems     Constitutional: No fever, chills   EYES: No visual symptoms   ENT: No URI symptoms   RESP: No cough, shortness of breath   CV: No chest pain, palpitations   GI: See above.    GU: No dysuria, frequency, urgency.  No vaginal discharge, itching, odor.   MSK: See above. No joint pain.    SKIN: No rashes   Neuro: No headaches, lightheadedness/dizziness        Past Medical/Surgical History          Past Medical History:  Diagnosis  Date         ?  Chronic kidney disease            congenital left hydronephrosis         ?  Hydronephrosis            congenital left side         ?  Reactive airway disease            Past Surgical History:         Procedure  Laterality  Date          ?  HX OTHER SURGICAL              "clean out" bilaterally          ?  HX TONSIL AND ADENOIDECTOMY                 Social History          Social History          Socioeconomic History         ?  Marital status:  SINGLE              Spouse name:  Not on file         ?  Number of children:  Not on file     ?  Years of education:  Not on file     ?  Highest education level:  Not on file       Tobacco Use         ?  Smoking status:  Never Smoker     ?  Smokeless tobacco:  Never Used       Substance and Sexual Activity         ?  Alcohol use:  No         ?  Drug use:  No             Family History          Family History         Problem  Relation  Age of Onset          ?  Cancer  Paternal Grandmother            ?  Cancer  Paternal Grandfather               Current Medications          Prior to Admission Medications     Prescriptions  Last Dose  Informant  Patient Reported?  Taking?      albuterol (PROVENTIL HFA, VENTOLIN HFA, PROAIR HFA) 90 mcg/actuation inhaler      Yes  No      Sig: Take  by inhalation.      butalb/acetaminophen/caffeine (FIORICET PO)      Yes  No      Sig: Take  by mouth as needed.               Facility-Administered Medications:  None             Allergies     No Known Allergies        Physical Exam          ED Triage Vitals     ED Encounter Vitals Group            BP  09/02/17 1931  117/69        Pulse (Heart Rate)  09/02/17 1931  62        Resp Rate  09/02/17 1931  18        Temp  09/02/17 1931  99.4 ??F (37.4 ??C)        Temp src  --          O2 Sat (%)  09/02/17 1931  100 %        Weight  09/02/17 1925  177 lb            Height  09/02/17 1925  5' 11"         Constitutional: Well developed, well nourished.    EYES: Conjuctivae clear, lids normal.  PERRLA.    ENT: Oral mucosa is pink, moist and without lesions.   RESP: Clear and equal BS. No respiratory distress, tachypnea, or accessory muscle use.   CV: Regular, without murmurs.   GI: Abdomen is soft, non-distended. Mild RLQ tenderness over McBurney's point. No rebound, guarding, or peritonitis. No abdominal masses appreciated  by inspection or palpation. Bilaterally no CVA tenderness.   MSK: Moves all 4 extremities spontaneously.    SKIN: Warm and dry without rashes.   NEURO: Alert, oriented.        Impression and Management Plan     Patient returns to ED for persistent RLQ abdominal pain.  Old records reviewed. She was discharged approximately 12 hours ago after being evaluated for the same.  There was concern for possible appendicitis because her appendix was not fully visualized on the CT.    At this time will repeat labs and obtain abdominal/pelvic CT with oral contrast for further evaluation.         Diagnostic Studies     Lab:      Results for orders placed or performed during the hospital encounter of 09/02/17     CT ABD PELV W CONT          Narrative       Indication: RLQ abdominal pain.    .          Impression          IMPRESSION: Mild free pelvic fluid. Probable right ovarian cyst rupture..      Comment: CT images of the abdomen and pelvis were obtained following   administration of oral and intravenous intravenous contrast. This was compared   with Sep 02, 2017 and Sep 10, 2015..      The lung bases are clear.      The liver spleen pancreas adrenal glands and gallbladder are unremarkable.      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 Otherwise the kidneys, ureters   and bladder are unremarkable. No radiopaque urinary tract calculi.      There is fullness in the right adnexa region along with moderate free pelvic   fluid that has increased since prior study. This is likely due to ovarian cyst   rupture.      The appendix is not optimally visualized.      No bowel obstruction or free intraperitoneal air.      DICOM format imaged data is available to non-affiliated external healthcare   facilities or entities on a secure, media free, reciprocally searchable basis   with patient authorization  for 12 months following the date of the study.          CBC WITH AUTOMATED DIFF         Result  Value  Ref Range  WBC  7.1  4.0 - 11.0 1000/mm3       RBC  4.61  3.60 - 5.20 M/uL       HGB  13.7  13.0 - 17.2 gm/dl       HCT  40.2  37.0 - 50.0 %       MCV  87.2  80.0 - 98.0 fL       MCH  29.7  25.4 - 34.6 pg       MCHC  34.1  30.0 - 36.0 gm/dl       PLATELET  278  140 - 450 1000/mm3       MPV  9.6  6.0 - 10.0 fL       RDW-SD  40.4  36.4 - 46.3         NRBC  0  0 - 0         IMMATURE GRANULOCYTES  0.3  0.0 - 3.0 %       NEUTROPHILS  56.5  34 - 64 %       LYMPHOCYTES  32.3  28 - 48 %       MONOCYTES  7.4  1 - 13 %       EOSINOPHILS  3.2  0 - 5 %       BASOPHILS  0.3  0 - 3 %       METABOLIC PANEL, COMPREHENSIVE         Result  Value  Ref Range            Sodium  139  136 - 145 mEq/L       Potassium  3.9  3.5 - 5.1 mEq/L       Chloride  109 (H)  98 - 107 mEq/L       CO2  24  21 - 32 mEq/L       Glucose  80  74 - 106 mg/dl       BUN  10  7 - 25 mg/dl       Creatinine  0.9  0.6 - 1.3 mg/dl       GFR est AA  >60.0          GFR est non-AA  >60          Calcium  8.8  8.5 - 10.1 mg/dl       AST (SGOT)  12 (L)  15 - 37 U/L       ALT (SGPT)  17  12 - 78 U/L       Alk. phosphatase  51  45 - 117  U/L       Bilirubin, total  0.4  0.2 - 1.0 mg/dl       Protein, total  6.8  6.4 - 8.2 gm/dl       Albumin  3.7  3.4 - 5.0 gm/dl       Anion gap  6  5 - 15 mmol/L       LIPASE         Result  Value  Ref Range            Lipase  199  73 - 393 U/L             ED Course/Medical Decision Making          Medications       sodium chloride (NS) flush 5-10 mL (has no administration in time range)     sodium chloride  0.9 % bolus infusion 1,000 mL (1,000 mL IntraVENous New Bag 09/02/17 2124)     morphine injection 8 mg (8 mg IntraVENous Given 09/02/17 2123)     ondansetron (ZOFRAN) injection 4 mg (4 mg IntraVENous Given 09/02/17 2123)     diatrizoate meg-diatrizoat sod (MD-GASTROVIEW,GASTROGRAFIN) 66-10 % contrast solution 30 mL (30 mL Oral Given 09/02/17 2124)       iopamidol (ISOVUE-370) 76 % injection 80 mL (80 mL IntraVENous Given 09/02/17 2246)        Patient remained stable throughout ED course.  CBC, CMP, and lipase unremarkable.  Old records reviewed.  Urine pregnancy and UA from earlier today were unremarkable so these tests  were not repeated.  CT abdomen/pelvis with oral contrast per radiology mild free pelvic fluid.  Probable right ovarian cyst rupture.  Results discussed with patient and family at bedside.  Patient felt stable for discharge.  Given ibuprofen, Norco, and  Zofran ODT prescriptions.  Instructed to return to ED in 2 days if pain persist.  Return to ED immediately for new or worsening symptoms.  Otherwise recommend follow-up with referral OB/GYN.        Final Diagnosis                 ICD-10-CM  ICD-9-CM          1.  Ovarian cyst rupture  N83.209  620.2          2.  RLQ abdominal pain  R10.31  789.03             Disposition     Home in stable condition.       Antoine Primas, PA-C   Sep 02, 2017      The patient was fully discussed with attending  [facility], Janine Ores, MD who agrees with the above assessment and plan.      Nursing notes have been reviewed by the physician/ advanced practice Clinician.       Dragon medical dictation was used for portions of this report. Unintended grammatical errors may occur.      My signature above authenticates this document and my orders, the final diagnosis (es), discharge prescription (s), and instructions in the Epic record. If you have any questions  please contact [phone].

## 2017-09-02 NOTE — ED Provider Notes (Signed)
ED Provider Notes by Antoine Primas, PA-C at 09/02/17 8182                Author: Antoine Primas, PA-C  Service: EMERGENCY  Author Type: Physician Assistant       Filed: 09/02/17 0803  Date of Service: 09/02/17 0628  Status: Attested           Editor: Antoine Primas, PA-C (Physician Assistant)  Cosigner: Serita Sheller, MD at 09/02/17 [phone]          Attestation signed by Serita Sheller, MD at 09/02/17 [phone]          I interviewed and examined the patient. I discussed the patient with the advanced practice provider, Antoine Primas, and I agree with the evaluation and plan  as documented here.      On my exam patient had pain in the lower abdomen right sided more so than left-sided.  She has had similar pain in the past but it has not been this persistent.  White blood cell count is normal.  Hemoglobin hematocrit and platelets are normal.  Basic  metabolic panel is normal.  LFTs are normal.  Lipase is normal.  Urinalysis is negative for leukocytes nitrites and blood.  Pregnancy screen is negative.  CT scan shows nonvisualization of the appendix however possibility of developing mild mesenteric  adenitis.  There is no significant underlying inflammatory change appreciated.  Pain had improved with IV fluid, morphine, Zofran.  At this point I think she is safe for discharge I recommended return in 12 to 24 hours for persistent symptoms.  Return  sooner if worse.  If she does come back she may need to have a repeat CT scan with both oral and IV contrast to try to better assist in visualizing the appendix.                                       [facility]   Emergency Department Treatment Report          Patient: Kim [name]  Age: 22 y.o.  Sex: female          Date of Birth: May 09, 1995  Admit Date: 09/02/2017  PCP: Dulce Sellar, MD     MRN: [id number]   CSN: [id number]   Attending: Serita Sheller, MD         Room: [id number]  Time Dictated: 6:28 AM  APP: Antoine Primas, PA-C         Chief Complaint      Chief Complaint       Patient presents with        ?  Abdominal Pain             History of Present Illness     22 y.o. female  complains of abrupt onset stabbing constant 8/10 lower abdominal pain since approx 20:00 yesterday.  Pain is more prominent on the right than the left.  Pain radiates around to right low back.  No nausea, vomiting, diarrhea, urinary symptoms, fevers,  chills.  No vaginal odor, discharge, irritation.  Has regular BM's. Last BM was yesterday. She is a virgin.  Took Motrin 800 mg around 20:00 and then Aleve PM around 22:00 with no improvement.       She has had similar pain a few times over the past year.  1 week ago had similar pain but  it only lasted for 4 hours and self resolved.  In the past pain has never lasted this long or been this intense.  Scheduled to start her menstrual cycle any day  now.  It is not unusual for her to get pelvic pain when she is on her menses but she never gets pelvic pain prior to menstruation.  No history of abdominal surgeries.        Review of Systems     Constitutional: No fever, chills   EYES: No visual symptoms   ENT: No URI symptoms   RESP: No cough, shortness of breath   CV: No chest pain   GI: See above.    GU: See above. No dysuria, frequency, urgency   MSK: See above.    SKIN: No rashes   Neuro: No headaches, lightheadedness/dizziness        Past Medical/Surgical History          Past Medical History:        Diagnosis  Date         ?  Chronic kidney disease            congenital left hydronephrosis         ?  Hydronephrosis            congenital left side         ?  Reactive airway disease            Past Surgical History:         Procedure  Laterality  Date          ?  HX OTHER SURGICAL              "clean out" bilaterally          ?  HX TONSIL AND ADENOIDECTOMY                 Social History          Social History          Socioeconomic History         ?  Marital status:  SINGLE              Spouse name:  Not on file          ?  Number of children:  Not on file     ?  Years of education:  Not on file     ?  Highest education level:  Not on file       Tobacco Use         ?  Smoking status:  Never Smoker     ?  Smokeless tobacco:  Never Used       Substance and Sexual Activity         ?  Alcohol use:  No         ?  Drug use:  No             Family History          Family History         Problem  Relation  Age of Onset          ?  Cancer  Paternal Grandmother            ?  Cancer  Paternal Grandfather               Current Medications          Prior to Admission  Medications     Prescriptions  Last Dose  Informant  Patient Reported?  Taking?      albuterol (PROVENTIL HFA, VENTOLIN HFA, PROAIR HFA) 90 mcg/actuation inhaler  08/02/2017 at Unknown time    Yes  Yes      Sig: Take  by inhalation.      butalb/acetaminophen/caffeine (FIORICET PO)  08/02/2017 at Unknown time    Yes  Yes      Sig: Take  by mouth as needed.               Facility-Administered Medications: None             Allergies     No Known Allergies        Physical Exam          ED Triage Vitals     ED Encounter Vitals Group            BP  09/01/17 2357  128/65        Pulse (Heart Rate)  09/01/17 2357  73        Resp Rate  09/01/17 2357  18        Temp  09/01/17 2357  99 ??F (37.2 ??C)        Temp src  --          O2 Sat (%)  09/01/17 2357  100 %        Weight  09/01/17 2350  177 lb            Height  09/01/17 2350  5' 11"         Constitutional: Well developed, well nourished.    EYES: Conjuctivae clear, lids normal.  PERRLA.    ENT: Oral mucosa is pink, moist and without lesions.   RESP: Clear and equal BS.    CV: Regular, without murmurs.   GI: Abdomen is soft, non-distended. Minimal tenderness across lower abdomen with slightly increased tenderness to RLQ over McBurney's point.  No rebound, guarding, or peritonitis. No hepatomegaly or splenomegaly. No abdominal masses appreciated by inspection or palpation.    GU: Preformed with female chaperone at bedside. External genitalia  without swelling, lesions, or discharge.  Bimanual exam performed with minimal  generalized discomfort.  No focal pain on cervical motion. No focal uterine or adnexal tenderness.   MSK: Moves all 4 extremities spontaneously.    SKIN: Warm and dry without rashes.   NEURO: Alert, oriented.        Impression and Management Plan     Patient with lower abdominal pain.  She reports increased tenderness to RLQ.  On exam there is slightly increased tenderness over McBurney's point; no guarding or  peritonitis.  She is a virgin and was very hesitant to have a pelvic exam.  She did allow a bimanual exam and there was minimal generalized discomfort but no focal cervical motion tenderness or adnexal tenderness. At this time will obtain labs, UA, pregnant,  and CT abdomen/pelvis to rule out appendicitis.        Diagnostic Studies     Lab:      Results for orders placed or performed during the hospital encounter of 09/02/17     CBC WITH AUTOMATED DIFF         Result  Value  Ref Range            WBC  9.7  4.0 - 11.0 1000/mm3       RBC  4.71  3.60 -  5.20 M/uL       HGB  14.2  13.0 - 17.2 gm/dl       HCT  41.2  37.0 - 50.0 %       MCV  87.5  80.0 - 98.0 fL       MCH  30.1  25.4 - 34.6 pg       MCHC  34.5  30.0 - 36.0 gm/dl       PLATELET  317  140 - 450 1000/mm3       MPV  9.9  6.0 - 10.0 fL       RDW-SD  41.1  36.4 - 46.3         NRBC  0  0 - 0         IMMATURE GRANULOCYTES  0.4  0.0 - 3.0 %       NEUTROPHILS  51.5  34 - 64 %       LYMPHOCYTES  36.7  28 - 48 %       MONOCYTES  7.5  1 - 13 %       EOSINOPHILS  3.5  0 - 5 %       BASOPHILS  0.4  0 - 3 %       METABOLIC PANEL, BASIC         Result  Value  Ref Range            Sodium  138  136 - 145 mEq/L       Potassium  3.6  3.5 - 5.1 mEq/L       Chloride  106  98 - 107 mEq/L       CO2  25  21 - 32 mEq/L       Glucose  83  74 - 106 mg/dl       BUN  11  7 - 25 mg/dl       Creatinine  1.0  0.6 - 1.3 mg/dl       GFR est AA  >60.0          GFR est non-AA  >60          Calcium  8.9  8.5 -  10.1 mg/dl       Anion gap  7  5 - 15 mmol/L       HEPATIC FUNCTION PANEL         Result  Value  Ref Range            AST (SGOT)  15  15 - 37 U/L       ALT (SGPT)  19  12 - 78 U/L       Alk. phosphatase  55  45 - 117 U/L       Bilirubin, total  0.4  0.2 - 1.0 mg/dl       Protein, total  7.3  6.4 - 8.2 gm/dl       Albumin  4.1  3.4 - 5.0 gm/dl       Bilirubin, direct  0.1  0.0 - 0.2 mg/dl       LIPASE         Result  Value  Ref Range            Lipase  222  73 - 393 U/L       POC URINE MACROSCOPIC         Result  Value  Ref Range  Glucose  Negative  NEGATIVE,Negative mg/dl       Bilirubin  Negative  NEGATIVE,Negative         Ketone  Negative  NEGATIVE,Negative mg/dl       Specific gravity  <=1.005  1.005 - 1.030         Blood  Negative  NEGATIVE,Negative         pH (UA)  7.0  5 - 9         Protein  Negative  NEGATIVE,Negative mg/dl       Urobilinogen  0.2  0.0 - 1.0 EU/dl       Nitrites  Negative  NEGATIVE,Negative         Leukocyte Esterase  Negative  NEGATIVE,Negative         Color  Yellow          Appearance  Clear          POC HCG,URINE         Result  Value  Ref Range            HCG urine, QL  negative  NEGATIVE,Negative,negative          CT Abdomen/Pelvis -- read by [facility]      The appendix is not seen on this exam.    The large and small bowel loops appear normal.    There is no evidence for bowel obstruction, inflammation or pneumatosis.    There is increase in the numbers of top normal size of lymph nodes within the mesenteric root which may be developing mild mesenteric lymphadenitis. There is no mesenteric fat stranding. Please correlate with patient's symptoms.          Normal uterus and ovaries.    Minimal free fluid in the pelvis that may represent physiologic changes or acute process not well-visualized.    There are normal appearing stomach, liver, gallbladder, pancreas and bilateral adrenal glands.    Normal spleen.    Normal left kidney and bladder.  Extrarenal pelvis right kidney.     No obstructive uropathy bilaterally.          There is associated compressive atelectasis with effusion.    There is no aggressive bony lesion.    There is no compression fracture.       Unremarkable aorta.    There is patent celiac artery, superior mesenteric artery, renal arteries, and inferior mesenteric artery.    There is patent common iliac arteries.    There is patent external and internal iliac arteries.       There is associated compressive atelectasis with effusion.    There is no aggressive bony lesion.    There is no compression fracture.      Impression   An appendix is not visualized at this time.  Appendicitis is not entirely excluded. Please correlate clinically and the diagnosis of acute appendicitis may have to be performed based on clinical rather than clinical and imaging findings.    Top normal lymph nodes in mesentery.   findings represent possible developing mild mesenteric adenitis.    Minimal free fluid in the pelvis that may represent physiologic changes or acute process not well-visualized.       Electronically signed on Sep 02, 2017 6:23:55 AM EDT by:   Charmaine Downs, MD   Diplomate, American Board of Radiology           ED Course/Medical Decision Making          Medications  sodium chloride (NS) flush 5-10 mL (has no administration in time range)     sodium chloride 0.9 % bolus infusion 1,000 mL (0 mL IntraVENous IV Completed 09/02/17 0625)     ondansetron (ZOFRAN) injection 4 mg (4 mg IntraVENous Given 09/02/17 0440)       morphine injection 4 mg (4 mg IntraVENous Given 09/02/17 0440)       iopamidol (ISOVUE-370) 76 % injection 80 mL (80 mL IntraVENous Given 09/02/17 0518)        CBC, BMP, hepatic function panel, and lipase unremarkable.  Urine negative for pregnancy and infection.  CT abdomen/pelvis with IV contrast appendix not visualized, appendicitis is  not entirely excluded; top normal lymph nodes in the mesentery, findings represent possible developing mild mesenteric adenitis;  minimal free fluid in the pelvis that may represent physiologic changes or acute process not well visualized.      Patient remained stable throughout ED course.  She received 1 L normal saline IV fluid, IV morphine, and IV Zofran.  Afterward patient reported that she was feeling better; pain 3/10.  On reexamination abdomen soft and benign with no clinical signs of  surgical pathology.  Results discussed with patient.  She understands that appendicitis has not been ruled out.  She also understands that pain may be due to lymph node inflammation.  Patient felt stable for discharge.  Instructed to take Tylenol and  ibuprofen as needed.  Return to ED for repeat CAT scan with oral contrast in 12 hours if pain persists.  Return to ED immediately for new or worsening symptoms.  Patient happy and agreeable with plan.        Final Diagnosis                 ICD-10-CM  ICD-9-CM          1.  RLQ abdominal pain  R10.31  789.03             Disposition     Home in stable condition.      Antoine Primas, PA-C   Sep 02, 2017      The patient was fully discussed with attending  NELSON, Ilene Qua, MD who agrees with the above assessment and plan.      Nursing notes have been reviewed by the physician/ advanced practice Clinician.      Dragon medical dictation was used for portions of this report. Unintended grammatical errors may occur.      My signature above authenticates this document and my orders, the final diagnosis (es), discharge prescription (s), and instructions in the Epic record. If you have any questions  please contact [phone].

## 2017-09-02 NOTE — ED Notes (Signed)
Pt finished Gastro fluids. Called Ct.  Estimated call time 2330

## 2017-09-02 NOTE — ED Notes (Signed)
6:51 AM  09/02/17     Discharge instructions given to patient (name) with verbalization of understanding. Patient accompanied by family.  Patient discharged with the following prescriptions none. Patient discharged to home (destination).      Mical Brun, RN

## 2017-09-02 NOTE — ED Provider Notes (Signed)
[facility]  Emergency Department Treatment Report    Patient: Kim [name] Age: 22 y.o. Sex: female    Date of Birth: [birth year] Admit Date: 09/02/2017 PCP: Dulce Sellar, MD   MRN: [id number]  CSN: [id number]  Attending: Helaine Chess, MD   Room: 109/EO09 Time Dictated: 10:07 PM APP: Antoine Primas, PA-C     Chief Complaint   Chief Complaint   Patient presents with   ??? Abdominal Pain     here last night as well        History of Present Illness   22 y.o. female returns to ED for RLQ abdominal pain.  Pain started abruptly Monday evening around 2000.  Pain is constant but waxes and wanes in intensity.  Pain is "pulsating".  Pain radiates around to right low back.  Evaluated in this ED earlier this morning for the same.  Abdominal CT could not fully visualize her appendix so she was instructed to return in 12 hours if pain persisted.  She has been taking Tylenol and ibuprofen throughout the day and pain has gradually been worsening.  She also had slight nausea earlier.  No vomiting.  No diarrhea.  No voiding symptoms.  No fevers or chills.  Last took Tylenol at 1600.    Review of Systems   Constitutional: No fever, chills  EYES: No visual symptoms  ENT: No URI symptoms  RESP: No cough, shortness of breath  CV: No chest pain, palpitations  GI: See above.   GU: No dysuria, frequency, urgency.  No vaginal discharge, itching, odor.  MSK: See above. No joint pain.   SKIN: No rashes  Neuro: No headaches, lightheadedness/dizziness    Past Medical/Surgical History     Past Medical History:   Diagnosis Date   ??? Chronic kidney disease     congenital left hydronephrosis   ??? Hydronephrosis     congenital left side   ??? Reactive airway disease      Past Surgical History:   Procedure Laterality Date   ??? HX OTHER SURGICAL      "clean out" bilaterally   ??? HX TONSIL AND ADENOIDECTOMY         Social History     Social History     Socioeconomic History   ??? Marital status: SINGLE     Spouse name: Not on file    ??? Number of children: Not on file   ??? Years of education: Not on file   ??? Highest education level: Not on file   Tobacco Use   ??? Smoking status: Never Smoker   ??? Smokeless tobacco: Never Used   Substance and Sexual Activity   ??? Alcohol use: No   ??? Drug use: No       Family History     Family History   Problem Relation Age of Onset   ??? Cancer Paternal Grandmother    ??? Cancer Paternal Grandfather        Current Medications     Prior to Admission Medications   Prescriptions Last Dose Informant Patient Reported? Taking?   albuterol (PROVENTIL HFA, VENTOLIN HFA, PROAIR HFA) 90 mcg/actuation inhaler   Yes No   Sig: Take  by inhalation.   butalb/acetaminophen/caffeine (FIORICET PO)   Yes No   Sig: Take  by mouth as needed.      Facility-Administered Medications: None       Allergies   No Known Allergies    Physical Exam  ED Triage Vitals   ED Encounter Vitals Group      BP 09/02/17 1931 117/69      Pulse (Heart Rate) 09/02/17 1931 62      Resp Rate 09/02/17 1931 18      Temp 09/02/17 1931 99.4 ??F (37.4 ??C)      Temp src --       O2 Sat (%) 09/02/17 1931 100 %      Weight 09/02/17 1925 177 lb      Height 09/02/17 1925 '5\' 11"'$      Constitutional: Well developed, well nourished.   EYES: Conjuctivae clear, lids normal.  PERRLA.   ENT: Oral mucosa is pink, moist and without lesions.  RESP: Clear and equal BS. No respiratory distress, tachypnea, or accessory muscle use.  CV: Regular, without murmurs.  GI: Abdomen is soft, non-distended. Mild RLQ tenderness over McBurney's point. No rebound, guarding, or peritonitis. No abdominal masses appreciated by inspection or palpation. Bilaterally no CVA tenderness.  MSK: Moves all 4 extremities spontaneously.   SKIN: Warm and dry without rashes.  NEURO: Alert, oriented.    Impression and Management Plan   Patient returns to ED for persistent RLQ abdominal pain.  Old records reviewed. She was discharged approximately 12 hours ago after being  evaluated for the same. There was concern for possible appendicitis because her appendix was not fully visualized on the CT.   At this time will repeat labs and obtain abdominal/pelvic CT with oral contrast for further evaluation.     Diagnostic Studies   Lab:   Results for orders placed or performed during the hospital encounter of 09/02/17   CT ABD PELV W CONT    Narrative    Indication: RLQ abdominal pain.    .      Impression    IMPRESSION: Mild free pelvic fluid. Probable right ovarian cyst rupture..    Comment: CT images of the abdomen and pelvis were obtained following  administration of oral and intravenous intravenous contrast. This was compared  with Sep 02, 2017 and Sep 10, 2015..    The lung bases are clear.    The liver spleen pancreas adrenal glands and gallbladder are unremarkable.    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 Otherwise the kidneys, ureters  and bladder are unremarkable. No radiopaque urinary tract calculi.    There is fullness in the right adnexa region along with moderate free pelvic  fluid that has increased since prior study. This is likely due to ovarian cyst  rupture.    The appendix is not optimally visualized.    No bowel obstruction or free intraperitoneal air.    DICOM format imaged data is available to non-affiliated external healthcare  facilities or entities on a secure, media free, reciprocally searchable basis  with patient authorization  for 12 months following the date of the study.     CBC WITH AUTOMATED DIFF   Result Value Ref Range    WBC 7.1 4.0 - 11.0 1000/mm3    RBC 4.61 3.60 - 5.20 M/uL    HGB 13.7 13.0 - 17.2 gm/dl    HCT 40.2 37.0 - 50.0 %    MCV 87.2 80.0 - 98.0 fL    MCH 29.7 25.4 - 34.6 pg    MCHC 34.1 30.0 - 36.0 gm/dl    PLATELET 278 140 - 450 1000/mm3    MPV 9.6 6.0 - 10.0 fL    RDW-SD 40.4 36.4 - 46.3  NRBC 0 0 - 0      IMMATURE GRANULOCYTES 0.3 0.0 - 3.0 %    NEUTROPHILS 56.5 34 - 64 %    LYMPHOCYTES 32.3 28 - 48 %     MONOCYTES 7.4 1 - 13 %    EOSINOPHILS 3.2 0 - 5 %    BASOPHILS 0.3 0 - 3 %   METABOLIC PANEL, COMPREHENSIVE   Result Value Ref Range    Sodium 139 136 - 145 mEq/L    Potassium 3.9 3.5 - 5.1 mEq/L    Chloride 109 (H) 98 - 107 mEq/L    CO2 24 21 - 32 mEq/L    Glucose 80 74 - 106 mg/dl    BUN 10 7 - 25 mg/dl    Creatinine 0.9 0.6 - 1.3 mg/dl    GFR est AA >60.0      GFR est non-AA >60      Calcium 8.8 8.5 - 10.1 mg/dl    AST (SGOT) 12 (L) 15 - 37 U/L    ALT (SGPT) 17 12 - 78 U/L    Alk. phosphatase 51 45 - 117 U/L    Bilirubin, total 0.4 0.2 - 1.0 mg/dl    Protein, total 6.8 6.4 - 8.2 gm/dl    Albumin 3.7 3.4 - 5.0 gm/dl    Anion gap 6 5 - 15 mmol/L   LIPASE   Result Value Ref Range    Lipase 199 73 - 393 U/L       ED Course/Medical Decision Making     Medications   sodium chloride (NS) flush 5-10 mL (has no administration in time range)   sodium chloride 0.9 % bolus infusion 1,000 mL (1,000 mL IntraVENous New Bag 09/02/17 2124)   morphine injection 8 mg (8 mg IntraVENous Given 09/02/17 2123)   ondansetron (ZOFRAN) injection 4 mg (4 mg IntraVENous Given 09/02/17 2123)   diatrizoate meg-diatrizoat sod (MD-GASTROVIEW,GASTROGRAFIN) 66-10 % contrast solution 30 mL (30 mL Oral Given 09/02/17 2124)   iopamidol (ISOVUE-370) 76 % injection 80 mL (80 mL IntraVENous Given 09/02/17 2246)     Patient remained stable throughout ED course.  CBC, CMP, and lipase unremarkable.  Old records reviewed.  Urine pregnancy and UA from earlier today were unremarkable so these tests were not repeated.  CT abdomen/pelvis with oral contrast per radiology mild free pelvic fluid.  Probable right ovarian cyst rupture.  Results discussed with patient and family at bedside.  Patient felt stable for discharge.  Given ibuprofen, Norco, and Zofran ODT prescriptions.  Instructed to return to ED in 2 days if pain persist.  Return to ED immediately for new or worsening symptoms.  Otherwise recommend follow-up with referral OB/GYN.    Final Diagnosis        ICD-10-CM ICD-9-CM   1. Ovarian cyst rupture N83.209 620.2   2. RLQ abdominal pain R10.31 789.03       Disposition   Home in stable condition.     Antoine Primas, PA-C  Sep 02, 2017    The patient was fully discussed with attending  [facility], Janine Ores, MD who agrees with the above assessment and plan.    Nursing notes have been reviewed by the physician/ advanced practice Clinician.    Dragon medical dictation was used for portions of this report. Unintended grammatical errors may occur.    My signature above authenticates this document and my orders, the final diagnosis (es), discharge prescription (s), and instructions in the Epic record. If you have  any questions please contact [phone].

## 2017-09-02 NOTE — ED Provider Notes (Signed)
[facility]  Emergency Department Treatment Report    Patient: Kim [name] Age: 22 y.o. Sex: female    Date of Birth: 28-Jun-1995 Admit Date: 09/02/2017 PCP: Dulce Sellar, MD   MRN: [id number]  CSN: [id number]  Attending: Serita Sheller, MD   Room: [id number] Time Dictated: 6:28 AM APP: Antoine Primas, PA-C     Chief Complaint   Chief Complaint   Patient presents with   ??? Abdominal Pain       History of Present Illness   22 y.o. female complains of abrupt onset stabbing constant 8/10 lower abdominal pain since approx 20:00 yesterday.  Pain is more prominent on the right than the left.  Pain radiates around to right low back.  No nausea, vomiting, diarrhea, urinary symptoms, fevers, chills.  No vaginal odor, discharge, irritation.  Has regular BM's. Last BM was yesterday. She is a virgin.  Took Motrin 800 mg around 20:00 and then Aleve PM around 22:00 with no improvement.     She has had similar pain a few times over the past year.  1 week ago had similar pain but it only lasted for 4 hours and self resolved.  In the past pain has never lasted this long or been this intense.  Scheduled to start her menstrual cycle any day now.  It is not unusual for her to get pelvic pain when she is on her menses but she never gets pelvic pain prior to menstruation.  No history of abdominal surgeries.    Review of Systems   Constitutional: No fever, chills  EYES: No visual symptoms  ENT: No URI symptoms  RESP: No cough, shortness of breath  CV: No chest pain  GI: See above.   GU: See above. No dysuria, frequency, urgency  MSK: See above.   SKIN: No rashes  Neuro: No headaches, lightheadedness/dizziness    Past Medical/Surgical History     Past Medical History:   Diagnosis Date   ??? Chronic kidney disease     congenital left hydronephrosis   ??? Hydronephrosis     congenital left side   ??? Reactive airway disease      Past Surgical History:   Procedure Laterality Date   ??? HX OTHER SURGICAL       "clean out" bilaterally   ??? HX TONSIL AND ADENOIDECTOMY         Social History     Social History     Socioeconomic History   ??? Marital status: SINGLE     Spouse name: Not on file   ??? Number of children: Not on file   ??? Years of education: Not on file   ??? Highest education level: Not on file   Tobacco Use   ??? Smoking status: Never Smoker   ??? Smokeless tobacco: Never Used   Substance and Sexual Activity   ??? Alcohol use: No   ??? Drug use: No       Family History     Family History   Problem Relation Age of Onset   ??? Cancer Paternal Grandmother    ??? Cancer Paternal Grandfather        Current Medications     Prior to Admission Medications   Prescriptions Last Dose Informant Patient Reported? Taking?   albuterol (PROVENTIL HFA, VENTOLIN HFA, PROAIR HFA) 90 mcg/actuation inhaler 08/02/2017 at Unknown time  Yes Yes   Sig: Take  by inhalation.   butalb/acetaminophen/caffeine (FIORICET PO) 08/02/2017 at Unknown time  Yes  Yes   Sig: Take  by mouth as needed.      Facility-Administered Medications: None       Allergies   No Known Allergies    Physical Exam     ED Triage Vitals   ED Encounter Vitals Group      BP 09/01/17 2357 128/65      Pulse (Heart Rate) 09/01/17 2357 73      Resp Rate 09/01/17 2357 18      Temp 09/01/17 2357 99 ??F (37.2 ??C)      Temp src --       O2 Sat (%) 09/01/17 2357 100 %      Weight 09/01/17 2350 177 lb      Height 09/01/17 2350 5' 11"      Constitutional: Well developed, well nourished.   EYES: Conjuctivae clear, lids normal.  PERRLA.   ENT: Oral mucosa is pink, moist and without lesions.  RESP: Clear and equal BS.   CV: Regular, without murmurs.  GI: Abdomen is soft, non-distended. Minimal tenderness across lower abdomen with slightly increased tenderness to RLQ over McBurney's point. No rebound, guarding, or peritonitis. No hepatomegaly or splenomegaly. No abdominal masses appreciated by inspection or palpation.   GU: Preformed with female chaperone at bedside. External genitalia without  swelling, lesions, or discharge.  Bimanual exam performed with minimal generalized discomfort.  No focal pain on cervical motion. No focal uterine or adnexal tenderness.  MSK: Moves all 4 extremities spontaneously.   SKIN: Warm and dry without rashes.  NEURO: Alert, oriented.    Impression and Management Plan   Patient with lower abdominal pain.  She reports increased tenderness to RLQ.  On exam there is slightly increased tenderness over McBurney's point; no guarding or peritonitis.  She is a virgin and was very hesitant to have a pelvic exam.  She did allow a bimanual exam and there was minimal generalized discomfort but no focal cervical motion tenderness or adnexal tenderness. At this time will obtain labs, UA, pregnant, and CT abdomen/pelvis to rule out appendicitis.    Diagnostic Studies   Lab:   Results for orders placed or performed during the hospital encounter of 09/02/17   CBC WITH AUTOMATED DIFF   Result Value Ref Range    WBC 9.7 4.0 - 11.0 1000/mm3    RBC 4.71 3.60 - 5.20 M/uL    HGB 14.2 13.0 - 17.2 gm/dl    HCT 41.2 37.0 - 50.0 %    MCV 87.5 80.0 - 98.0 fL    MCH 30.1 25.4 - 34.6 pg    MCHC 34.5 30.0 - 36.0 gm/dl    PLATELET 317 140 - 450 1000/mm3    MPV 9.9 6.0 - 10.0 fL    RDW-SD 41.1 36.4 - 46.3      NRBC 0 0 - 0      IMMATURE GRANULOCYTES 0.4 0.0 - 3.0 %    NEUTROPHILS 51.5 34 - 64 %    LYMPHOCYTES 36.7 28 - 48 %    MONOCYTES 7.5 1 - 13 %    EOSINOPHILS 3.5 0 - 5 %    BASOPHILS 0.4 0 - 3 %   METABOLIC PANEL, BASIC   Result Value Ref Range    Sodium 138 136 - 145 mEq/L    Potassium 3.6 3.5 - 5.1 mEq/L    Chloride 106 98 - 107 mEq/L    CO2 25 21 - 32 mEq/L    Glucose 83 74 - 106 mg/dl  BUN 11 7 - 25 mg/dl    Creatinine 1.0 0.6 - 1.3 mg/dl    GFR est AA >60.0      GFR est non-AA >60      Calcium 8.9 8.5 - 10.1 mg/dl    Anion gap 7 5 - 15 mmol/L   HEPATIC FUNCTION PANEL   Result Value Ref Range    AST (SGOT) 15 15 - 37 U/L    ALT (SGPT) 19 12 - 78 U/L    Alk. phosphatase 55 45 - 117 U/L     Bilirubin, total 0.4 0.2 - 1.0 mg/dl    Protein, total 7.3 6.4 - 8.2 gm/dl    Albumin 4.1 3.4 - 5.0 gm/dl    Bilirubin, direct 0.1 0.0 - 0.2 mg/dl   LIPASE   Result Value Ref Range    Lipase 222 73 - 393 U/L   POC URINE MACROSCOPIC   Result Value Ref Range    Glucose Negative NEGATIVE,Negative mg/dl    Bilirubin Negative NEGATIVE,Negative      Ketone Negative NEGATIVE,Negative mg/dl    Specific gravity <=1.005 1.005 - 1.030      Blood Negative NEGATIVE,Negative      pH (UA) 7.0 5 - 9      Protein Negative NEGATIVE,Negative mg/dl    Urobilinogen 0.2 0.0 - 1.0 EU/dl    Nitrites Negative NEGATIVE,Negative      Leukocyte Esterase Negative NEGATIVE,Negative      Color Yellow      Appearance Clear     POC HCG,URINE   Result Value Ref Range    HCG urine, QL negative NEGATIVE,Negative,negative       CT Abdomen/Pelvis -- read by [facility]    The appendix is not seen on this exam.   The large and small bowel loops appear normal.   There is no evidence for bowel obstruction, inflammation or pneumatosis.   There is increase in the numbers of top normal size of lymph nodes within the mesenteric root which may be developing mild mesenteric lymphadenitis. There is no mesenteric fat stranding. Please correlate with patient's symptoms.       Normal uterus and ovaries.   Minimal free fluid in the pelvis that may represent physiologic changes or acute process not well-visualized.   There are normal appearing stomach, liver, gallbladder, pancreas and bilateral adrenal glands.   Normal spleen.   Normal left kidney and bladder.  Extrarenal pelvis right kidney.   No obstructive uropathy bilaterally.       There is associated compressive atelectasis with effusion.   There is no aggressive bony lesion.   There is no compression fracture.     Unremarkable aorta.   There is patent celiac artery, superior mesenteric artery, renal arteries, and inferior mesenteric artery.   There is patent common iliac arteries.    There is patent external and internal iliac arteries.     There is associated compressive atelectasis with effusion.   There is no aggressive bony lesion.   There is no compression fracture.    Impression  An appendix is not visualized at this time.  Appendicitis is not entirely excluded. Please correlate clinically and the diagnosis of acute appendicitis may have to be performed based on clinical rather than clinical and imaging findings.   Top normal lymph nodes in mesentery.   findings represent possible developing mild mesenteric adenitis.   Minimal free fluid in the pelvis that may represent physiologic changes or acute process not well-visualized.  Electronically signed on Sep 02, 2017 6:23:55 AM EDT by:  Charmaine Downs, MD  Diplomate, [employer] Board of Radiology      ED Course/Medical Decision Making     Medications   sodium chloride (NS) flush 5-10 mL (has no administration in time range)   sodium chloride 0.9 % bolus infusion 1,000 mL (0 mL IntraVENous IV Completed 09/02/17 0625)   ondansetron (ZOFRAN) injection 4 mg (4 mg IntraVENous Given 09/02/17 0440)   morphine injection 4 mg (4 mg IntraVENous Given 09/02/17 0440)   iopamidol (ISOVUE-370) 76 % injection 80 mL (80 mL IntraVENous Given 09/02/17 0518)     CBC, BMP, hepatic function panel, and lipase unremarkable.  Urine negative for pregnancy and infection.  CT abdomen/pelvis with IV contrast appendix not visualized, appendicitis is not entirely excluded; top normal lymph nodes in the mesentery, findings represent possible developing mild mesenteric adenitis; minimal free fluid in the pelvis that may represent physiologic changes or acute process not well visualized.    Patient remained stable throughout ED course.  She received 1 L normal saline IV fluid, IV morphine, and IV Zofran.  Afterward patient reported that she was feeling better; pain 3/10.  On reexamination abdomen soft and benign with no clinical signs of surgical pathology.  Results discussed  with patient.  She understands that appendicitis has not been ruled out.  She also understands that pain may be due to lymph node inflammation.  Patient felt stable for discharge.  Instructed to take Tylenol and ibuprofen as needed.  Return to ED for repeat CAT scan with oral contrast in 12 hours if pain persists.  Return to ED immediately for new or worsening symptoms.  Patient happy and agreeable with plan.    Final Diagnosis       ICD-10-CM ICD-9-CM   1. RLQ abdominal pain R10.31 789.03       Disposition   Home in stable condition.    Antoine Primas, PA-C  Sep 02, 2017    The patient was fully discussed with attending  NELSON, Ilene Qua, MD who agrees with the above assessment and plan.    Nursing notes have been reviewed by the physician/ advanced practice Clinician.    Dragon medical dictation was used for portions of this report. Unintended grammatical errors may occur.    My signature above authenticates this document and my orders, the final diagnosis (es), discharge prescription (s), and instructions in the Epic record. If you have any questions please contact [phone].

## 2017-09-03 LAB — METABOLIC PANEL, COMPREHENSIVE
ALT (SGPT): 17 U/L (ref 12–78)
AST (SGOT): 12 U/L — ABNORMAL LOW (ref 15–37)
Albumin: 3.7 gm/dl (ref 3.4–5.0)
Alk. phosphatase: 51 U/L (ref 45–117)
Anion gap: 6 mmol/L (ref 5–15)
BUN: 10 mg/dl (ref 7–25)
Bilirubin, total: 0.4 mg/dl (ref 0.2–1.0)
CO2: 24 mEq/L (ref 21–32)
Calcium: 8.8 mg/dl (ref 8.5–10.1)
Chloride: 109 mEq/L — ABNORMAL HIGH (ref 98–107)
Creatinine: 0.9 mg/dl (ref 0.6–1.3)
GFR est AA: >60
GFR est non-AA: >60
Glucose: 80 mg/dl (ref 74–106)
Potassium: 3.9 mEq/L (ref 3.5–5.1)
Protein, total: 6.8 gm/dl (ref 6.4–8.2)
Sodium: 139 mEq/L (ref 136–145)

## 2017-09-03 LAB — CBC WITH AUTOMATED DIFF
BASOPHILS: 0.3 % (ref 0–3)
EOSINOPHILS: 3.2 % (ref 0–5)
HCT: 40.2 % (ref 37.0–50.0)
HGB: 13.7 gm/dl (ref 13.0–17.2)
IMMATURE GRANULOCYTES: 0.3 % (ref 0.0–3.0)
LYMPHOCYTES: 32.3 % (ref 28–48)
MCH: 29.7 pg (ref 25.4–34.6)
MCHC: 34.1 gm/dl (ref 30.0–36.0)
MCV: 87.2 fL (ref 80.0–98.0)
MONOCYTES: 7.4 % (ref 1–13)
MPV: 9.6 fL (ref 6.0–10.0)
NEUTROPHILS: 56.5 % (ref 34–64)
NRBC: 0 (ref 0–0)
PLATELET: 278 10*3/uL (ref 140–450)
RBC: 4.61 M/uL (ref 3.60–5.20)
RDW-SD: 40.4 (ref 36.4–46.3)
WBC: 7.1 10*3/uL (ref 4.0–11.0)

## 2017-09-03 LAB — LIPASE
Lipase: 199 U/L (ref 73–393)
Lipase: 199 U/L (ref 73–393)

## 2017-09-03 LAB — CBC WITH AUTO DIFFERENTIAL
Basophils %: 0.3 % (ref 0–3)
Eosinophils %: 3.2 % (ref 0–5)
Hematocrit: 40.2 % (ref 37.0–50.0)
Hemoglobin: 13.7 gm/dl (ref 13.0–17.2)
Immature Granulocytes: 0.3 % (ref 0.0–3.0)
Lymphocytes %: 32.3 % (ref 28–48)
MCH: 29.7 pg (ref 25.4–34.6)
MCHC: 34.1 gm/dl (ref 30.0–36.0)
MCV: 87.2 fL (ref 80.0–98.0)
MPV: 9.6 fL (ref 6.0–10.0)
Monocytes %: 7.4 % (ref 1–13)
Neutrophils %: 56.5 % (ref 34–64)
Nucleated RBCs: 0 (ref 0–0)
Platelets: 278 10*3/uL (ref 140–450)
RBC: 4.61 M/uL (ref 3.60–5.20)
RDW-SD: 40.4 (ref 36.4–46.3)
WBC: 7.1 10*3/uL (ref 4.0–11.0)

## 2017-09-03 LAB — COMPREHENSIVE METABOLIC PANEL
ALT: 17 U/L (ref 12–78)
AST: 12 U/L — ABNORMAL LOW (ref 15–37)
Albumin: 3.7 gm/dl (ref 3.4–5.0)
Alkaline Phosphatase: 51 U/L (ref 45–117)
Anion Gap: 6 mmol/L (ref 5–15)
BUN: 10 mg/dl (ref 7–25)
CO2: 24 mEq/L (ref 21–32)
Calcium: 8.8 mg/dl (ref 8.5–10.1)
Chloride: 109 mEq/L — ABNORMAL HIGH (ref 98–107)
Creatinine: 0.9 mg/dl (ref 0.6–1.3)
EGFR IF NonAfrican American: >60
GFR African American: >60
Glucose: 80 mg/dl (ref 74–106)
Potassium: 3.9 mEq/L (ref 3.5–5.1)
Sodium: 139 mEq/L (ref 136–145)
Total Bilirubin: 0.4 mg/dl (ref 0.2–1.0)
Total Protein: 6.8 gm/dl (ref 6.4–8.2)

## 2017-09-03 MED ORDER — MORPHINE 4 MG/ML SYRINGE
4 mg/mL | INTRAMUSCULAR | Status: AC
Start: 2017-09-03 — End: 2017-09-02
  Administered 2017-09-03: 01:00:00 via INTRAVENOUS

## 2017-09-03 MED ORDER — IBUPROFEN 800 MG TAB
800 mg | ORAL_TABLET | Freq: Three times a day (TID) | ORAL | 0 refills | Status: DC | PRN
Start: 2017-09-03 — End: 2018-05-25

## 2017-09-03 MED ORDER — ONDANSETRON (PF) 4 MG/2 ML INJECTION
4 mg/2 mL | Freq: Once | INTRAMUSCULAR | Status: AC
Start: 2017-09-03 — End: 2017-09-02
  Administered 2017-09-03: 01:00:00 via INTRAVENOUS

## 2017-09-03 MED ORDER — DIATRIZOATE MEGLUMINE & SODIUM 66 %-10 % ORAL SOLN
66-10 % | Freq: Once | ORAL | Status: AC
Start: 2017-09-03 — End: 2017-09-02
  Administered 2017-09-03: 01:00:00 via ORAL

## 2017-09-03 MED ORDER — ONDANSETRON HCL 4 MG TAB
4 mg | ORAL_TABLET | Freq: Three times a day (TID) | ORAL | 0 refills | Status: DC | PRN
Start: 2017-09-03 — End: 2018-01-11

## 2017-09-03 MED ORDER — IOPAMIDOL 76 % IV SOLN
370 mg iodine /mL (76 %) | Freq: Once | INTRAVENOUS | Status: AC
Start: 2017-09-03 — End: 2017-09-02
  Administered 2017-09-03: 03:00:00 via INTRAVENOUS

## 2017-09-03 MED ORDER — HYDROCODONE-ACETAMINOPHEN 5 MG-325 MG TAB
5-325 mg | ORAL_TABLET | Freq: Four times a day (QID) | ORAL | 0 refills | Status: AC | PRN
Start: 2017-09-03 — End: 2017-09-07

## 2017-09-03 MED ORDER — SODIUM CHLORIDE 0.9% BOLUS IV
0.9 % | INTRAVENOUS | Status: AC
Start: 2017-09-03 — End: 2017-09-02
  Administered 2017-09-03: 01:00:00 via INTRAVENOUS

## 2017-09-03 MED ORDER — SODIUM CHLORIDE 0.9 % IJ SYRG
Freq: Once | INTRAMUSCULAR | Status: DC
Start: 2017-09-03 — End: 2017-09-03

## 2017-09-03 MED FILL — SODIUM CHLORIDE 0.9 % IV: INTRAVENOUS | Qty: 1000

## 2017-09-03 MED FILL — ISOVUE-370  76 % INTRAVENOUS SOLUTION: 370 mg iodine /mL (76 %) | INTRAVENOUS | Qty: 80

## 2017-09-03 MED FILL — MORPHINE 4 MG/ML SYRINGE: 4 mg/mL | INTRAMUSCULAR | Qty: 2

## 2017-09-03 MED FILL — MD-GASTROVIEW 66 %-10 % ORAL SOLUTION: 66-10 % | ORAL | Qty: 30

## 2017-09-03 MED FILL — ONDANSETRON (PF) 4 MG/2 ML INJECTION: 4 mg/2 mL | INTRAMUSCULAR | Qty: 2

## 2017-09-03 NOTE — ED Notes (Signed)
12:39 AM  09/03/17     Discharge instructions given to Kim [name] (name) with verbalization of understanding. Patient accompanied by family.  Patient discharged with the following prescriptions see avs. Patient discharged to home (destination).      Sharene Skeans, RN

## 2017-09-03 NOTE — ED Notes (Signed)
12:39 AM  09/03/17     Discharge instructions given to Kim [name], Kim [name] (name) with verbalization of understanding. Patient accompanied by family.  Patient discharged with the following prescriptions see avs. Patient discharged to home (destination).      Jaleesa Munroe, RN

## 2018-01-11 ENCOUNTER — Emergency Department: Admit: 2018-01-11 | Payer: TRICARE (CHAMPUS) | Primary: Family Medicine

## 2018-01-11 ENCOUNTER — Inpatient Hospital Stay
Admit: 2018-01-11 | Discharge: 2018-01-12 | Disposition: A | Discharge status: Home / Self Care | Payer: TRICARE (CHAMPUS) | Attending: Emergency Medicine

## 2018-01-11 DIAGNOSIS — M549 Dorsalgia, unspecified: Secondary | ICD-10-CM

## 2018-01-11 LAB — CBC WITH AUTOMATED DIFF
BASOPHILS: 0.2 % (ref 0–3)
EOSINOPHILS: 2.9 % (ref 0–5)
HCT: 41.5 % (ref 37.0–50.0)
HGB: 14.6 gm/dl (ref 13.0–17.2)
IMMATURE GRANULOCYTES: 0.2 % (ref 0.0–3.0)
LYMPHOCYTES: 36.1 % (ref 28–48)
MCH: 29.9 pg (ref 25.4–34.6)
MCHC: 35.2 gm/dl (ref 30.0–36.0)
MCV: 84.9 fL (ref 80.0–98.0)
MONOCYTES: 6.6 % (ref 1–13)
MPV: 9.4 fL (ref 6.0–10.0)
NEUTROPHILS: 54 % (ref 34–64)
NRBC: 0 (ref 0–0)
PLATELET: 377 10*3/uL (ref 140–450)
RBC: 4.89 M/uL (ref 3.60–5.20)
RDW-SD: 39.4 (ref 36.4–46.3)
WBC: 9.1 10*3/uL (ref 4.0–11.0)

## 2018-01-11 LAB — METABOLIC PANEL, COMPREHENSIVE
ALT (SGPT): 19 U/L (ref 12–78)
AST (SGOT): 10 U/L — ABNORMAL LOW (ref 15–37)
Albumin: 3.7 gm/dl (ref 3.4–5.0)
Alk. phosphatase: 55 U/L (ref 45–117)
Anion gap: 7 mmol/L (ref 5–15)
BUN: 8 mg/dl (ref 7–25)
Bilirubin, total: 0.4 mg/dl (ref 0.2–1.0)
CO2: 28 mEq/L (ref 21–32)
Calcium: 9.5 mg/dl (ref 8.5–10.1)
Chloride: 105 mEq/L (ref 98–107)
Creatinine: 1 mg/dl (ref 0.6–1.3)
GFR est AA: >60
GFR est non-AA: >60
Glucose: 76 mg/dl (ref 74–106)
Potassium: 3.7 mEq/L (ref 3.5–5.1)
Protein, total: 7.4 gm/dl (ref 6.4–8.2)
Sodium: 139 mEq/L (ref 136–145)

## 2018-01-11 LAB — POC URINE MICROSCOPIC

## 2018-01-11 LAB — POC URINE MACROSCOPIC
Bilirubin, Urine: NEGATIVE
Bilirubin: NEGATIVE
Glucose, Ur: NEGATIVE mg/dl
Glucose: NEGATIVE mg/dl
Ketone: NEGATIVE mg/dl
Ketones, Urine: NEGATIVE mg/dl
Nitrite, Urine: NEGATIVE
Nitrites: NEGATIVE
Protein, UA: NEGATIVE mg/dl
Protein: NEGATIVE mg/dl
Specific Gravity, UA: 1.01 (ref 1.005–1.030)
Specific gravity: 1.01 (ref 1.005–1.030)
Urobilinogen, UA, POCT: 0.2 EU/dl (ref 0.0–1.0)
Urobilinogen: 0.2 EU/dl (ref 0.0–1.0)
pH (UA): 6 (ref 5–9)
pH, UA: 6 (ref 5–9)

## 2018-01-11 LAB — POC HCG,URINE
HCG urine, QL: NEGATIVE
Pregnancy Test(Urn): NEGATIVE

## 2018-01-11 LAB — CBC WITH AUTO DIFFERENTIAL
Basophils %: 0.2 % (ref 0–3)
Eosinophils %: 2.9 % (ref 0–5)
Hematocrit: 41.5 % (ref 37.0–50.0)
Hemoglobin: 14.6 gm/dl (ref 13.0–17.2)
Immature Granulocytes: 0.2 % (ref 0.0–3.0)
Lymphocytes %: 36.1 % (ref 28–48)
MCH: 29.9 pg (ref 25.4–34.6)
MCHC: 35.2 gm/dl (ref 30.0–36.0)
MCV: 84.9 fL (ref 80.0–98.0)
MPV: 9.4 fL (ref 6.0–10.0)
Monocytes %: 6.6 % (ref 1–13)
Neutrophils %: 54 % (ref 34–64)
Nucleated RBCs: 0 (ref 0–0)
Platelets: 377 10*3/uL (ref 140–450)
RBC: 4.89 M/uL (ref 3.60–5.20)
RDW-SD: 39.4 (ref 36.4–46.3)
WBC: 9.1 10*3/uL (ref 4.0–11.0)

## 2018-01-11 LAB — COMPREHENSIVE METABOLIC PANEL
ALT: 19 U/L (ref 12–78)
AST: 10 U/L — ABNORMAL LOW (ref 15–37)
Albumin: 3.7 gm/dl (ref 3.4–5.0)
Alkaline Phosphatase: 55 U/L (ref 45–117)
Anion Gap: 7 mmol/L (ref 5–15)
BUN: 8 mg/dl (ref 7–25)
CO2: 28 mEq/L (ref 21–32)
Calcium: 9.5 mg/dl (ref 8.5–10.1)
Chloride: 105 mEq/L (ref 98–107)
Creatinine: 1 mg/dl (ref 0.6–1.3)
EGFR IF NonAfrican American: >60
GFR African American: >60
Glucose: 76 mg/dl (ref 74–106)
Potassium: 3.7 mEq/L (ref 3.5–5.1)
Sodium: 139 mEq/L (ref 136–145)
Total Bilirubin: 0.4 mg/dl (ref 0.2–1.0)
Total Protein: 7.4 gm/dl (ref 6.4–8.2)

## 2018-01-11 MED ORDER — SODIUM CHLORIDE 0.9% BOLUS IV
0.9 % | INTRAVENOUS | Status: AC
Start: 2018-01-11 — End: 2018-01-11
  Administered 2018-01-11: 22:00:00 via INTRAVENOUS

## 2018-01-11 MED ORDER — ONDANSETRON (PF) 4 MG/2 ML INJECTION
4 mg/2 mL | INTRAMUSCULAR | Status: AC
Start: 2018-01-11 — End: 2018-01-11
  Administered 2018-01-11: 23:00:00 via INTRAVENOUS

## 2018-01-11 MED ORDER — KETOROLAC TROMETHAMINE 30 MG/ML INJECTION
30 mg/mL (1 mL) | INTRAMUSCULAR | Status: AC
Start: 2018-01-11 — End: 2018-01-11
  Administered 2018-01-11: via INTRAVENOUS

## 2018-01-11 MED ORDER — ACETAMINOPHEN 325 MG TABLET
325 mg | ORAL | Status: AC
Start: 2018-01-11 — End: 2018-01-11
  Administered 2018-01-11: 23:00:00 via ORAL

## 2018-01-11 MED ORDER — SODIUM CHLORIDE 0.9 % IJ SYRG
Freq: Once | INTRAMUSCULAR | Status: AC
Start: 2018-01-11 — End: 2018-01-11
  Administered 2018-01-11: 22:00:00 via INTRAVENOUS

## 2018-01-11 MED FILL — KETOROLAC TROMETHAMINE 30 MG/ML INJECTION: 30 mg/mL (1 mL) | INTRAMUSCULAR | Qty: 1

## 2018-01-11 MED FILL — ONDANSETRON (PF) 4 MG/2 ML INJECTION: 4 mg/2 mL | INTRAMUSCULAR | Qty: 2

## 2018-01-11 MED FILL — TYLENOL 325 MG TABLET: 325 mg | ORAL | Qty: 2

## 2018-01-11 NOTE — ED Notes (Signed)
Patient is sent by [facility] with C/O of dizziness and lower back pain. Per pt the dizziness started since yesterday with nausea but the nausea got better.

## 2018-01-11 NOTE — ED Notes (Signed)
9:33 PM  01/11/18     Discharge instructions given to [facility] (name) with verbalization of understanding. Patient accompanied by family.  Patient discharged with the following prescriptions none. Patient discharged to home (destination).      Greig Castilla

## 2018-01-11 NOTE — ED Notes (Signed)
Gave report to [employer], Charity fundraiser

## 2018-01-11 NOTE — ED Provider Notes (Signed)
ED Provider Notes by Maxwell Marion, [place] at 01/11/18 1710                Author: Maxwell Marion, [place]  Service: Emergency Medicine  Author Type: Physician Assistant       Filed: 01/12/18 0212  Date of Service: 01/11/18 1710  Status: Attested Addendum          Editor: Maxwell Marion, [place] (Physician Assistant)       Related Notes: Original Note by Maxwell Marion, [place] (Physician Assistant) filed at 01/12/18  2979          Cosigner: Sharlotte Alamo, MD at 01/15/18 1157          Attestation signed by Sharlotte Alamo, MD at 01/15/18 1157          I, Sharlotte Alamo, MD , have personally seen and examined this patient; I have fully participated in the care of this patient with the advanced practice provider.   I have reviewed and agree with all pertinent clinical information including history, physical exam, studies and the plan.  I have also reviewed and agree with the medications, allergies and past medical history sections for this patient.             Sharlotte Alamo, MD   January 15, 2018                                  [facility]   Emergency Department Treatment Report                Patient: Kim [name]  Age: 22 y.o.  Sex: female          Date of Birth: [birth year]  Admit Date: 01/11/2018  PCP: Dulce Sellar, MD     MRN: [id number]   CSN: [id number]   Attending: Sharlotte Alamo, MD         Room: ER14/ER14  Time Dictated: 5:10 PM  [id number]           Chief Complaint    Right back pain, dizziness        History of Present Illness     22 y.o. female  presenting with complaints of right-sided back pain and dizziness x1 day.  Patient reports that she developed the right sided back pain starting yesterday with some intermittent right lower quadrant pain., states that is been constant with episodes of  sharp pains.  Reports her pain is 8.5 out of 10 in severity.  She states she has had 2 episodes of dizziness where she reports that her head "feels foggy and in the clouds," and a  headache is at the base of her skull.  She reports an episode of nausea  and diarrhea yesterday.  Patient reports that she felt unwell 3 days ago with associated subjective fever at that time.  Ports that she tried Motrin and Tylenol yesterday without relief of pain.  She states her GYN put her oral contraceptive to with ovarian  cyst, but has had slight spotting the last 4 months.  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  Denies fever, chills, chest pain,  shortness of breath, vomiting, urinary symptoms.        Review of Systems     Review of Systems    Constitutional: Negative for chills  and fever.    HENT: Negative for congestion, sinus pain and sore throat.     Eyes: Negative for blurred vision and photophobia.    Respiratory: Negative for cough and shortness of breath.     Cardiovascular: Negative for chest pain and palpitations.    Gastrointestinal: Positive for abdominal pain, diarrhea  and nausea. Negative for vomiting.    Genitourinary: Negative for dysuria, frequency, hematuria and urgency.    Musculoskeletal: Positive for back pain.    Skin: Negative for rash.    Neurological: Positive for dizziness and headaches . Negative for tremors, speech change, focal weakness and weakness.            Past Medical/Surgical History          Past Medical History:        Diagnosis  Date         ?  Chronic kidney disease            congenital left hydronephrosis         ?  Hydronephrosis            congenital left side         ?  Reactive airway disease            Past Surgical History:         Procedure  Laterality  Date          ?  HX ORTHOPAEDIC         ?  HX OTHER SURGICAL              "clean out" bilaterally          ?  HX TONSIL AND ADENOIDECTOMY                 Social History          Social History          Socioeconomic History         ?  Marital status:  SINGLE              Spouse name:   Not on file         ?  Number of children:  Not on file     ?  Years of education:  Not on file     ?  Highest education level:  Not on file       Occupational History        ?  Not on file       Social Needs         ?  Financial resource strain:  Not on file        ?  Food insecurity:              Worry:  Not on file         Inability:  Not on file        ?  Transportation needs:              Medical:  Not on file         Non-medical:  Not on file       Tobacco Use         ?  Smoking status:  Never Smoker     ?  Smokeless tobacco:  Never Used       Substance and Sexual Activity         ?  Alcohol use:  No     ?  Drug use:  No     ?  Sexual activity:  Not on file       Lifestyle        ?  Physical activity:              Days per week:  Not on file         Minutes per session:  Not on file         ?  Stress:  Not on file       Relationships        ?  Social connections:              Talks on phone:  Not on file         Gets together:  Not on file         Attends religious service:  Not on file         Active member of club or organization:  Not on file         Attends meetings of clubs or organizations:  Not on file         Relationship status:  Not on file        ?  Intimate partner violence:              Fear of current or ex partner:  Not on file         Emotionally abused:  Not on file         Physically abused:  Not on file         Forced sexual activity:  Not on file        Other Topics  Concern        ?  Not on file       Social History Narrative        ?  Not on file             Family History          Family History         Problem  Relation  Age of Onset          ?  Cancer  Paternal Grandmother            ?  Cancer  Paternal Grandfather               Current Medications          Prior to Admission Medications     Prescriptions  Last Dose  Informant  Patient Reported?  Taking?      albuterol (PROVENTIL HFA, VENTOLIN HFA, PROAIR HFA) 90 mcg/actuation inhaler  12/11/2017 at Unknown time    Yes  Yes      Sig:  Take  by inhalation.      butalb/acetaminophen/caffeine (FIORICET PO)  12/11/2017 at Unknown time    Yes  Yes      Sig: Take  by mouth as needed.      ibuprofen (MOTRIN) 800 mg tablet  12/11/2017 at Unknown time    No  Yes      Sig: Take 1 Tab by mouth every eight (8) hours as needed for Pain (and inflammation.).               Facility-Administered Medications: None             Allergies     No Known Allergies        Physical Exam  ED Triage Vitals     ED Encounter Vitals Group            BP  01/11/18 1646  133/67        Pulse (Heart Rate)  01/11/18 1646  62        Resp Rate  01/11/18 1653  18        Temp  01/11/18 1646  98.7 ??F (37.1 ??C)        Temp src  --          O2 Sat (%)  01/11/18 1646  99 %        Weight  01/11/18 1646  178 lb            Height  01/11/18 1646  5' 10"         Physical Exam    Constitutional: She is oriented to person, place, and time and well-developed, well-nourished, and in no distress. No distress.    HENT:    Head: Normocephalic and atraumatic.    Eyes: Pupils are equal, round, and reactive to light. Conjunctivae and EOM are normal.    Neck: Normal range of motion. Neck supple.    Cardiovascular: Normal rate, regular rhythm and normal heart sounds.    No murmur heard.   Pulmonary/Chest: Effort normal and breath sounds normal. No respiratory distress. She has no wheezes.    Abdominal: Soft. Bowel sounds are normal. There is no tenderness.   Hypoactive bowel sounds, abdomen is nondistended  Patient with minimal tenderness with palpation of the right upper and  right lower quadrant  Negative testing, no rebound, guarding, or mass felt  Psoas and obturator sign  No pain with jumping on her heels multiple times   Musculoskeletal: Normal range of motion.    Right-sided CVA tenderness  No tenderness of the spine   Neurological:  She is alert and oriented to person, place, and time. Gait normal. GCS score is 15.    Skin: Skin is warm and dry. She is not diaphoretic.   Psychiatric: Affect  normal.             Impression and Management Plan     22 year old female presented with right-sided back pain and right lower quadrant pain associated dizziness x1 day.      DDX: Nephrolithiasis versus pyelonephritis versus muscle sprain/strain versus appendicitis versus ovarian cyst versus ovarian torsion      Normal saline bolus dose, CBC, CMP, UA, urine pregnancy     Diagnostic Studies     Lab:      Recent Results (from the past 12 hour(s))     CBC WITH AUTOMATED DIFF          Collection Time: 01/11/18  5:40 PM         Result  Value  Ref Range            WBC  9.1  4.0 - 11.0 1000/mm3       RBC  4.89  3.60 - 5.20 M/uL       HGB  14.6  13.0 - 17.2 gm/dl       HCT  41.5  37.0 - 50.0 %       MCV  84.9  80.0 - 98.0 fL       MCH  29.9  25.4 - 34.6 pg       MCHC  35.2  30.0 - 36.0 gm/dl       PLATELET  377  140 -  450 1000/mm3       MPV  9.4  6.0 - 10.0 fL       RDW-SD  39.4  36.4 - 46.3         NRBC  0  0 - 0         IMMATURE GRANULOCYTES  0.2  0.0 - 3.0 %       NEUTROPHILS  54.0  34 - 64 %       LYMPHOCYTES  36.1  28 - 48 %       MONOCYTES  6.6  1 - 13 %       EOSINOPHILS  2.9  0 - 5 %       BASOPHILS  0.2  0 - 3 %       METABOLIC PANEL, COMPREHENSIVE          Collection Time: 01/11/18  5:40 PM         Result  Value  Ref Range            Sodium  139  136 - 145 mEq/L       Potassium  3.7  3.5 - 5.1 mEq/L       Chloride  105  98 - 107 mEq/L       CO2  28  21 - 32 mEq/L       Glucose  76  74 - 106 mg/dl       BUN  8  7 - 25 mg/dl       Creatinine  1.0  0.6 - 1.3 mg/dl       GFR est AA  >60.0          GFR est non-AA  >60          Calcium  9.5  8.5 - 10.1 mg/dl       AST (SGOT)  10 (L)  15 - 37 U/L       ALT (SGPT)  19  12 - 78 U/L       Alk. phosphatase  55  45 - 117 U/L       Bilirubin, total  0.4  0.2 - 1.0 mg/dl       Protein, total  7.4  6.4 - 8.2 gm/dl       Albumin  3.7  3.4 - 5.0 gm/dl       Anion gap  7  5 - 15 mmol/L       POC URINE MACROSCOPIC          Collection Time: 01/11/18  5:53 PM         Result  Value   Ref Range            Glucose  Negative  NEGATIVE,Negative mg/dl       Bilirubin  Negative  NEGATIVE,Negative         Ketone  Negative  NEGATIVE,Negative mg/dl       Specific gravity  1.010  1.005 - 1.030         Blood  Moderate (A)  NEGATIVE,Negative         pH (UA)  6.0  5 - 9         Protein  Negative  NEGATIVE,Negative mg/dl       Urobilinogen  0.2  0.0 - 1.0 EU/dl       Nitrites  Negative  NEGATIVE,Negative         Leukocyte Esterase  Small (A)  NEGATIVE,Negative  Color  Yellow          Appearance  Cloudy          POC URINE MICROSCOPIC          Collection Time: 01/11/18  5:53 PM         Result  Value  Ref Range            Epithelial cells, squamous  5-9  /LPF       WBC  1-4  /HPF       POC HCG,URINE          Collection Time: 01/11/18  5:54 PM         Result  Value  Ref Range            HCG urine, QL  negative  NEGATIVE,Negative,negative            Labs Reviewed       METABOLIC PANEL, COMPREHENSIVE - Abnormal; Notable for the following components:            Result  Value            AST (SGOT)  10 (*)            All other components within normal limits       POC URINE MACROSCOPIC - Abnormal; Notable for the following components:            Blood  Moderate (*)         Leukocyte Esterase  Small (*)            All other components within normal limits       CBC WITH AUTOMATED DIFF     POC URINE MICROSCOPIC       POC HCG,URINE           Imaging:     Ct Abd Pelv Wo Cont      Result Date: 01/11/2018   DICOM format image data is available to nonaffiliated external healthcare facilities or entities on a secure, media free, reciprocally searchable basis with patient authorization for 12 months following the date of the study. TECHNIQUE: CT of the abdomen  and pelvis WITHOUT intravenous contrast.  The abdomen and pelvis were scanned utilizing a multidetector helical scanner from the diaphragm to the lesser trochanter without the oral administration of barium.  Coronal and sagittal reformations were obtained.   COMPARISON: CT abdomen pelvis 09/02/2017 INDICATION: Right flank pain DISCUSSION: ABSENCE OF INTRAVENOUS CONTRAST DECREASES SENSITIVITY FOR DETECTION OF FOCAL LESIONS AND VASCULAR PATHOLOGY. LOWER THORAX: Normal. HEPATOBILIARY: No focal hepatic lesions.   No biliary ductal dilatation. SPLEEN: No focal lesion or splenomegaly. PANCREAS: No focal masses or ductal dilatation. ADRENALS: No adrenal nodules. KIDNEYS/URETERS: Prominent left renal pelvis without significant change. No hydronephrosis or stone otherwise  seen. No hydroureter. PELVIC ORGANS/BLADDER: Unremarkable. PERITONEUM / RETROPERITONEUM: Minimal free pelvic fluid posteriorly. No free air. LYMPH NODES: Upper normal size superior mesenteric nodes without significant change. VESSELS: Unremarkable. GI  TRACT: No distention or wall thickening. No evidence of appendicitis. BONES AND SOFT TISSUES: Unremarkable.       IMPRESSION: 1. Borderline size mesenteric lymph nodes. Question mesenteric adenitis. No significant change from prior study. 2. Minimal free pelvic fluid decreased from prior study. 3. No evidence of acute obstructive uropathy.            ED Course          Medications       sodium chloride (NS) flush 5-10 mL (10  mL IntraVENous Given 01/11/18 1828)     sodium chloride 0.9 % bolus infusion 1,000 mL (0 mL IntraVENous IV Completed 01/11/18 1926)     acetaminophen (TYLENOL) tablet 650 mg (650 mg Oral Given 01/11/18 1903)     ondansetron (ZOFRAN) injection 4 mg (4 mg IntraVENous Given 01/11/18 1922)       ketorolac (TORADOL) injection 15 mg (15 mg IntraVENous Given 01/11/18 1932)              Medical Decision Making     22 y.o. female previously healthy presenting with complaints of right-sided back pain, intermittent RLQ abdominal pain, and dizziness x1 day with associated subjective fever and nausea.  Denies heavy lifting, fever, chills, chest pain, shortness of breath, vomiting, urinary symptoms.      Patient's vital signs have been stable during her time in  ER, she is been afebrile not tachycardic.  On exam patient is not well-appearing, but is not toxic.  She had a benign exam, normal heart sounds, lungs are clear to auscultation, right-sided CVA  tenderness, hypoactive bowel sounds, and minimal right lower quadrant tenderness.  She did not exhibit signs of appendicitis including obturator, rosvings, psoas, and exhibited no pain with jumping on heels.  Patient does have a history of ovarian cyst,  she is been on birth control pills for the last 4 months, and her main complaint is her right sided back pain.  This is unlikely ovarian torsion, due to the minimal pain on presentation and her right lower quadrant.  CBC within normal limits, no evidence  of leukocytosis.  Normal CMP.  Urine analysis showed small leukocyte esterase and blood, white blood count 1-4, epithelial cells 5-9.  This is likely contamination versus infection, unlikely pyelonephritis with lack of white blood cell count and lack  of urinary symptoms.  Patient received CT scan which was directly visualized by Dr. Carmell Austria with no evidence of acute abnormality.  It did show enlarged mesenteric lymph nodes which is unchanged from previous scan, and free fluid in the pelvis which  is improved from previous scan.  Discussed the option of a transvaginal ultrasound with patient and mom at bedside, patient declined at this time.      She received p.o. Tylenol, p.o. Zofran, and IV Toradol for her pain her time here in the ER.  Discussed the goals with patient and mom at bedside.  Discussed there is no acute identifiable cause of patient's back pain at this time.  She was educated and  verbalized understanding to return to the ER immediately for any new or worsening symptoms.  Additionally she was advised to use symptomatic treatment with ice/heat and NSAIDs to help relieve her pain.  Patient and mom at bedside agreeable to plan of  discharge.           Final Diagnosis                 ICD-10-CM  ICD-9-CM           1.  Acute back pain, unspecified back location, unspecified back pain laterality  M54.9  724.5          2.  Dizziness  R42  780.4             Disposition     [facility] home      Discharge Medication List as of 01/11/2018  9:08 PM                  The patient  was personally evaluated by myself and Dr. Sharlotte Alamo, MD who agrees with the above assessment and plan.      Maxwell Marion, PA-C   January 12, 2018      My signature above authenticates this document and my orders, the final     diagnosis (es), discharge prescription (s), and instructions in the Epic     record.   If you have any questions please contact [phone].       Nursing notes have been reviewed by the physician/ advanced practice     Clinician.      Dragon medical dictation software was used for portions of this report. Unintended voice recognition errors may occur.

## 2018-01-11 NOTE — ED Notes (Signed)
Received patient from out going nurse Mariama  Patient complains of back pain radiating to the abdomen and nausea and dizziness patient given tylenol but still in pain doctor aware ordered toradol

## 2018-01-11 NOTE — ED Notes (Addendum)
Received patient from out going nurse Mariama  Patient complains of back pain radiating to the abdomen and nausea and dizziness patient given tylenol but still in pain doctor aware ordered toradol

## 2018-01-11 NOTE — ED Triage Notes (Signed)
Patient is sent by [facility] with C/O of dizziness and lower back pain. Per pt the dizziness started since yesterday with nausea but the nausea got better.

## 2018-01-11 NOTE — ED Notes (Signed)
9:33 PM  01/11/18     Discharge instructions given to Kim [name] (name) with verbalization of understanding. Patient accompanied by family.  Patient discharged with the following prescriptions none. Patient discharged to home (destination).      Kim [name]

## 2018-01-11 NOTE — ED Notes (Signed)
Gave report to Serah, RN

## 2018-01-11 NOTE — ED Provider Notes (Addendum)
[facility]  Emergency Department Treatment Report        Patient: Kim [name] Age: 22 y.o. Sex: female    Date of Birth: 1995-12-16 Admit Date: 01/11/2018 PCP: Dulce Sellar, MD   MRN: [id number]  CSN: [id number]  Attending: Sharlotte Alamo, MD   Room: ER14/ER14 Time Dictated: 5:10 PM [id number]       Chief Complaint   Right back pain, dizziness    History of Present Illness   22 y.o. female presenting with complaints of right-sided back pain and dizziness x1 day.  Patient reports that she developed the right sided back pain starting yesterday with some intermittent right lower quadrant pain., states that is been constant with episodes of sharp pains.  Reports her pain is 8.5 out of 10 in severity.  She states she has had 2 episodes of dizziness where she reports that her head "feels foggy and in the clouds," and a headache is at the base of her skull.  She reports an episode of nausea and diarrhea yesterday.  Patient reports that she felt unwell 3 days ago with associated subjective fever at that time.  Ports that she tried Motrin and Tylenol yesterday without relief of pain.  She states her GYN put her oral contraceptive to with ovarian cyst, but has had slight spotting the last 4 months.  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  Denies fever, chills, chest pain, shortness of breath, vomiting, urinary symptoms.    Review of Systems   Review of Systems   Constitutional: Negative for chills and fever.   HENT: Negative for congestion, sinus pain and sore throat.    Eyes: Negative for blurred vision and photophobia.   Respiratory: Negative for cough and shortness of breath.    Cardiovascular: Negative for chest pain and palpitations.   Gastrointestinal: Positive for abdominal pain, diarrhea and nausea. Negative for vomiting.    Genitourinary: Negative for dysuria, frequency, hematuria and urgency.   Musculoskeletal: Positive for back pain.   Skin: Negative for rash.   Neurological: Positive for dizziness and headaches. Negative for tremors, speech change, focal weakness and weakness.       Past Medical/Surgical History     Past Medical History:   Diagnosis Date   ??? Chronic kidney disease     congenital left hydronephrosis   ??? Hydronephrosis     congenital left side   ??? Reactive airway disease      Past Surgical History:   Procedure Laterality Date   ??? HX ORTHOPAEDIC     ??? HX OTHER SURGICAL      "clean out" bilaterally   ??? HX TONSIL AND ADENOIDECTOMY         Social History     Social History     Socioeconomic History   ??? Marital status: SINGLE     Spouse name: Not on file   ??? Number of children: Not on file   ??? Years of education: Not on file   ??? Highest education level: Not on file   Occupational History   ??? Not on file   Social Needs   ??? Financial resource strain: Not on file   ??? Food insecurity:     Worry: Not on file     Inability: Not on file   ??? Transportation needs:     Medical: Not on file  Non-medical: Not on file   Tobacco Use   ??? Smoking status: Never Smoker   ??? Smokeless tobacco: Never Used   Substance and Sexual Activity   ??? Alcohol use: No   ??? Drug use: No   ??? Sexual activity: Not on file   Lifestyle   ??? Physical activity:     Days per week: Not on file     Minutes per session: Not on file   ??? Stress: Not on file   Relationships   ??? Social connections:     Talks on phone: Not on file     Gets together: Not on file     Attends religious service: Not on file     Active member of club or organization: Not on file     Attends meetings of clubs or organizations: Not on file     Relationship status: Not on file   ??? Intimate partner violence:     Fear of current or ex partner: Not on file     Emotionally abused: Not on file     Physically abused: Not on file     Forced sexual activity: Not on file   Other Topics Concern    ??? Not on file   Social History Narrative   ??? Not on file       Family History     Family History   Problem Relation Age of Onset   ??? Cancer Paternal Grandmother    ??? Cancer Paternal Grandfather        Current Medications     Prior to Admission Medications   Prescriptions Last Dose Informant Patient Reported? Taking?   albuterol (PROVENTIL HFA, VENTOLIN HFA, PROAIR HFA) 90 mcg/actuation inhaler 12/11/2017 at Unknown time  Yes Yes   Sig: Take  by inhalation.   butalb/acetaminophen/caffeine (FIORICET PO) 12/11/2017 at Unknown time  Yes Yes   Sig: Take  by mouth as needed.   ibuprofen (MOTRIN) 800 mg tablet 12/11/2017 at Unknown time  No Yes   Sig: Take 1 Tab by mouth every eight (8) hours as needed for Pain (and inflammation.).      Facility-Administered Medications: None       Allergies   No Known Allergies    Physical Exam     ED Triage Vitals   ED Encounter Vitals Group      BP 01/11/18 1646 133/67      Pulse (Heart Rate) 01/11/18 1646 62      Resp Rate 01/11/18 1653 18      Temp 01/11/18 1646 98.7 ??F (37.1 ??C)      Temp src --       O2 Sat (%) 01/11/18 1646 99 %      Weight 01/11/18 1646 178 lb      Height 01/11/18 1646 5' 10"      Physical Exam   Constitutional: She is oriented to person, place, and time and well-developed, well-nourished, and in no distress. No distress.   HENT:   Head: Normocephalic and atraumatic.   Eyes: Pupils are equal, round, and reactive to light. Conjunctivae and EOM are normal.   Neck: Normal range of motion. Neck supple.   Cardiovascular: Normal rate, regular rhythm and normal heart sounds.   No murmur heard.  Pulmonary/Chest: Effort normal and breath sounds normal. No respiratory distress. She has no wheezes.   Abdominal: Soft. Bowel sounds are normal. There is no tenderness.   Hypoactive bowel sounds, abdomen is nondistended  Patient with minimal  tenderness with palpation of the right upper and right lower quadrant  Negative testing, no rebound, guarding, or mass felt   Psoas and obturator sign  No pain with jumping on her heels multiple times   Musculoskeletal: Normal range of motion.   Right-sided CVA tenderness  No tenderness of the spine   Neurological: She is alert and oriented to person, place, and time. Gait normal. GCS score is 15.   Skin: Skin is warm and dry. She is not diaphoretic.   Psychiatric: Affect normal.        Impression and Management Plan   22 year old female presented with right-sided back pain and right lower quadrant pain associated dizziness x1 day.    DDX: Nephrolithiasis versus pyelonephritis versus muscle sprain/strain versus appendicitis versus ovarian cyst versus ovarian torsion    Normal saline bolus dose, CBC, CMP, UA, urine pregnancy  Diagnostic Studies   Lab:   Recent Results (from the past 12 hour(s))   CBC WITH AUTOMATED DIFF    Collection Time: 01/11/18  5:40 PM   Result Value Ref Range    WBC 9.1 4.0 - 11.0 1000/mm3    RBC 4.89 3.60 - 5.20 M/uL    HGB 14.6 13.0 - 17.2 gm/dl    HCT 41.5 37.0 - 50.0 %    MCV 84.9 80.0 - 98.0 fL    MCH 29.9 25.4 - 34.6 pg    MCHC 35.2 30.0 - 36.0 gm/dl    PLATELET 377 140 - 450 1000/mm3    MPV 9.4 6.0 - 10.0 fL    RDW-SD 39.4 36.4 - 46.3      NRBC 0 0 - 0      IMMATURE GRANULOCYTES 0.2 0.0 - 3.0 %    NEUTROPHILS 54.0 34 - 64 %    LYMPHOCYTES 36.1 28 - 48 %    MONOCYTES 6.6 1 - 13 %    EOSINOPHILS 2.9 0 - 5 %    BASOPHILS 0.2 0 - 3 %   METABOLIC PANEL, COMPREHENSIVE    Collection Time: 01/11/18  5:40 PM   Result Value Ref Range    Sodium 139 136 - 145 mEq/L    Potassium 3.7 3.5 - 5.1 mEq/L    Chloride 105 98 - 107 mEq/L    CO2 28 21 - 32 mEq/L    Glucose 76 74 - 106 mg/dl    BUN 8 7 - 25 mg/dl    Creatinine 1.0 0.6 - 1.3 mg/dl    GFR est AA >60.0      GFR est non-AA >60      Calcium 9.5 8.5 - 10.1 mg/dl    AST (SGOT) 10 (L) 15 - 37 U/L    ALT (SGPT) 19 12 - 78 U/L    Alk. phosphatase 55 45 - 117 U/L    Bilirubin, total 0.4 0.2 - 1.0 mg/dl    Protein, total 7.4 6.4 - 8.2 gm/dl    Albumin 3.7 3.4 - 5.0 gm/dl     Anion gap 7 5 - 15 mmol/L   POC URINE MACROSCOPIC    Collection Time: 01/11/18  5:53 PM   Result Value Ref Range    Glucose Negative NEGATIVE,Negative mg/dl    Bilirubin Negative NEGATIVE,Negative      Ketone Negative NEGATIVE,Negative mg/dl    Specific gravity 1.010 1.005 - 1.030      Blood Moderate (A) NEGATIVE,Negative      pH (UA) 6.0 5 - 9      Protein Negative  NEGATIVE,Negative mg/dl    Urobilinogen 0.2 0.0 - 1.0 EU/dl    Nitrites Negative NEGATIVE,Negative      Leukocyte Esterase Small (A) NEGATIVE,Negative      Color Yellow      Appearance Cloudy     POC URINE MICROSCOPIC    Collection Time: 01/11/18  5:53 PM   Result Value Ref Range    Epithelial cells, squamous 5-9 /LPF    WBC 1-4 /HPF   POC HCG,URINE    Collection Time: 01/11/18  5:54 PM   Result Value Ref Range    HCG urine, QL negative NEGATIVE,Negative,negative       Labs Reviewed   METABOLIC PANEL, COMPREHENSIVE - Abnormal; Notable for the following components:       Result Value    AST (SGOT) 10 (*)     All other components within normal limits   POC URINE MACROSCOPIC - Abnormal; Notable for the following components:    Blood Moderate (*)     Leukocyte Esterase Small (*)     All other components within normal limits   CBC WITH AUTOMATED DIFF   POC URINE MICROSCOPIC   POC HCG,URINE       Imaging:    Ct Abd Pelv Wo Cont    Result Date: 01/11/2018  DICOM format image data is available to nonaffiliated external healthcare facilities or entities on a secure, media free, reciprocally searchable basis with patient authorization for 12 months following the date of the study. TECHNIQUE: CT of the abdomen and pelvis WITHOUT intravenous contrast.  The abdomen and pelvis were scanned utilizing a multidetector helical scanner from the diaphragm to the lesser trochanter without the oral administration of barium.  Coronal and sagittal reformations were obtained. COMPARISON: CT abdomen pelvis 09/02/2017 INDICATION: Right flank  pain DISCUSSION: ABSENCE OF INTRAVENOUS CONTRAST DECREASES SENSITIVITY FOR DETECTION OF FOCAL LESIONS AND VASCULAR PATHOLOGY. LOWER THORAX: Normal. HEPATOBILIARY: No focal hepatic lesions.  No biliary ductal dilatation. SPLEEN: No focal lesion or splenomegaly. PANCREAS: No focal masses or ductal dilatation. ADRENALS: No adrenal nodules. KIDNEYS/URETERS: Prominent left renal pelvis without significant change. No hydronephrosis or stone otherwise seen. No hydroureter. PELVIC ORGANS/BLADDER: Unremarkable. PERITONEUM / RETROPERITONEUM: Minimal free pelvic fluid posteriorly. No free air. LYMPH NODES: Upper normal size superior mesenteric nodes without significant change. VESSELS: Unremarkable. GI TRACT: No distention or wall thickening. No evidence of appendicitis. BONES AND SOFT TISSUES: Unremarkable.     IMPRESSION: 1. Borderline size mesenteric lymph nodes. Question mesenteric adenitis. No significant change from prior study. 2. Minimal free pelvic fluid decreased from prior study. 3. No evidence of acute obstructive uropathy.       ED Course     Medications   sodium chloride (NS) flush 5-10 mL (10 mL IntraVENous Given 01/11/18 1828)   sodium chloride 0.9 % bolus infusion 1,000 mL (0 mL IntraVENous IV Completed 01/11/18 1926)   acetaminophen (TYLENOL) tablet 650 mg (650 mg Oral Given 01/11/18 1903)   ondansetron (ZOFRAN) injection 4 mg (4 mg IntraVENous Given 01/11/18 1922)   ketorolac (TORADOL) injection 15 mg (15 mg IntraVENous Given 01/11/18 1932)        Medical Decision Making   22 y.o. female previously healthy presenting with complaints of right-sided back pain, intermittent RLQ abdominal pain, and dizziness x1 day with associated subjective fever and nausea. Denies heavy lifting, fever, chills, chest pain, shortness of breath, vomiting, urinary symptoms.    Patient's vital signs have been stable during her time in ER, she is been afebrile not  tachycardic.  On exam patient is not well-appearing, but is  not toxic.  She had a benign exam, normal heart sounds, lungs are clear to auscultation, right-sided CVA tenderness, hypoactive bowel sounds, and minimal right lower quadrant tenderness.  She did not exhibit signs of appendicitis including obturator, rosvings, psoas, and exhibited no pain with jumping on heels.  Patient does have a history of ovarian cyst, she is been on birth control pills for the last 4 months, and her main complaint is her right sided back pain.  This is unlikely ovarian torsion, due to the minimal pain on presentation and her right lower quadrant.  CBC within normal limits, no evidence of leukocytosis.  Normal CMP.  Urine analysis showed small leukocyte esterase and blood, white blood count 1-4, epithelial cells 5-9.  This is likely contamination versus infection, unlikely pyelonephritis with lack of white blood cell count and lack of urinary symptoms.  Patient received CT scan which was directly visualized by Dr. Carmell Austria with no evidence of acute abnormality.  It did show enlarged mesenteric lymph nodes which is unchanged from previous scan, and free fluid in the pelvis which is improved from previous scan.  Discussed the option of a transvaginal ultrasound with patient and mom at bedside, patient declined at this time.    She received p.o. Tylenol, p.o. Zofran, and IV Toradol for her pain her time here in the ER.  Discussed the goals with patient and mom at bedside.  Discussed there is no acute identifiable cause of patient's back pain at this time.  She was educated and verbalized understanding to return to the ER immediately for any new or worsening symptoms.  Additionally she was advised to use symptomatic treatment with ice/heat and NSAIDs to help relieve her pain.  Patient and mom at bedside agreeable to plan of discharge.      Final Diagnosis       ICD-10-CM ICD-9-CM   1. Acute back pain, unspecified back location, unspecified back pain laterality M54.9 724.5   2. Dizziness R42 780.4        Disposition   Dicharge home   Discharge Medication List as of 01/11/2018  9:08 PM          The patient was personally evaluated by myself and Dr. Sharlotte Alamo, MD who agrees with the above assessment and plan.    Maxwell Marion, PA-C  January 12, 2018    My signature above authenticates this document and my orders, the final    diagnosis (es), discharge prescription (s), and instructions in the Epic    record.  If you have any questions please contact [phone].     Nursing notes have been reviewed by the physician/ advanced practice    Clinician.    Dragon medical dictation software was used for portions of this report. Unintended voice recognition errors may occur.

## 2018-02-13 NOTE — Progress Notes (Signed)
Faxed imaging to [facility] [phone];' RENAL SCAN; F/U 03/27/18

## 2018-02-13 NOTE — Progress Notes (Signed)
Per [facility], patient does not wish to schedule renal scan at this time. She is waiting to get new insurance. Follow up 03/25/18

## 2018-02-13 NOTE — Progress Notes (Signed)
Per [facility], patient does not wish to schedule renal scan at this time. She is waiting to get new insurance. Follow up 03/25/18

## 2018-02-13 NOTE — Progress Notes (Signed)
Faxed imaging to [facility] [phone];' RENAL SCAN; F/U 03/27/18

## 2018-03-18 NOTE — Telephone Encounter (Signed)
Spoke w/ pt to confirm 05/25/18 4:30pm appt. Pt said she will CB after New Year with new insurance info and request new order for lasix scan to be done prior to follow up appt.

## 2018-03-25 ENCOUNTER — Encounter: Attending: Urology | Primary: Family Medicine

## 2018-03-27 ENCOUNTER — Encounter: Attending: Urology | Primary: Family Medicine

## 2018-04-20 ENCOUNTER — Encounter

## 2018-04-20 NOTE — Progress Notes (Signed)
Appointment Information   Name: Shalamar, Juhasz MRN: [id number]   Date: 05/07/2019 Status: Sch   Time: 9:40 AM Length: 40   Visit Type: CT UROGRAPHY [15319]

## 2018-04-20 NOTE — Progress Notes (Signed)
Faxed imaging to [facility] [phone]; renal scan; f/u 05/25/18

## 2018-04-20 NOTE — Telephone Encounter (Signed)
Good Morning,  PT called in to give New Ins info and also wants to have her Renal Scan order be sent to [facility] to get scheduled prior to her upcoming appt w/ Dr. Judie Petit on 05/25/18. If she needs to be reached her Ph # is [phone].    Thank you,  TRoth

## 2018-04-20 NOTE — Progress Notes (Signed)
Faxed imaging to [facility] [phone]; renal scan; f/u 05/25/18

## 2018-04-20 NOTE — Progress Notes (Signed)
Appointment Information   Name: [name], Kim MRN: [id number]   Date: 05/07/2019 Status: Sch   Time: 9:40 AM Length: 40   Visit Type: CT UROGRAPHY [15319]

## 2018-04-21 ENCOUNTER — Encounter

## 2018-04-28 ENCOUNTER — Inpatient Hospital Stay: Admit: 2018-04-28 | Payer: PRIVATE HEALTH INSURANCE | Attending: Physician Assistant | Primary: Family Medicine

## 2018-04-28 DIAGNOSIS — N135 Crossing vessel and stricture of ureter without hydronephrosis: Secondary | ICD-10-CM

## 2018-04-28 MED ORDER — FUROSEMIDE 10 MG/ML IJ SOLN
10 mg/mL | Freq: Once | INTRAMUSCULAR | Status: AC
Start: 2018-04-28 — End: 2018-04-28
  Administered 2018-04-28: 20:00:00 via INTRAVENOUS

## 2018-04-28 MED ORDER — TECHNETIUM TC 99M MERTIATIDE
Freq: Once | Status: AC
Start: 2018-04-28 — End: 2018-04-28
  Administered 2018-04-28: 19:00:00 via INTRAVENOUS

## 2018-04-28 MED FILL — FUROSEMIDE 10 MG/ML IJ SOLN: 10 mg/mL | INTRAMUSCULAR | Qty: 2

## 2018-05-25 ENCOUNTER — Ambulatory Visit: Attending: Urology | Primary: Family Medicine

## 2018-05-25 ENCOUNTER — Encounter: Attending: Urology | Primary: Family Medicine

## 2018-05-25 ENCOUNTER — Ambulatory Visit
Admit: 2018-05-25 | Discharge: 2018-05-26 | Payer: PRIVATE HEALTH INSURANCE | Attending: Urology | Primary: Family Medicine

## 2018-05-25 DIAGNOSIS — N135 Crossing vessel and stricture of ureter without hydronephrosis: Secondary | ICD-10-CM

## 2018-05-25 LAB — AMB POC URINALYSIS DIP STICK AUTO W/O MICRO
Bilirubin (UA POC): NEGATIVE
Bilirubin, Urine, POC: NEGATIVE
Blood (UA POC): NEGATIVE
Blood (UA POC): NEGATIVE
Glucose (UA POC): NEGATIVE
Glucose, Urine, POC: NEGATIVE
Ketones (UA POC): NEGATIVE
Ketones, Urine, POC: NEGATIVE
Leukocyte Esterase, Urine, POC: NEGATIVE
Leukocyte esterase (UA POC): NEGATIVE
Nitrite, Urine, POC: NEGATIVE
Nitrites (UA POC): NEGATIVE
Protein (UA POC): NEGATIVE
Protein, Urine, POC: NEGATIVE
Specific Gravity, Urine, POC: 1.02 NA (ref 1.001–1.035)
Specific gravity (UA POC): 1.02 (ref 1.001–1.035)
Urobilinogen (UA POC): 0.2 (ref 0.2–1)
Urobilinogen, POC: 0.2 (ref 0.2–1)
pH (UA POC): 7 (ref 4.6–8.0)
pH, Urine, POC: 7 NA (ref 4.6–8.0)

## 2018-05-25 NOTE — Progress Notes (Signed)
Progress Notes by Ruben Reason, MD at 05/25/18 1630                Author: Ruben Reason, MD  Service: --  Author Type: Physician       Filed: 05/25/18 1716  Encounter Date: 05/25/2018  Status: Signed          Editor: Ruben Reason, MD (Physician)                       Kim [name]   [birth year]      ASSESSMENT:              ICD-10-CM  ICD-9-CM             1.  Ureteropelvic junction (UPJ) obstruction  N13.5  593.4  AMB POC URINALYSIS DIP STICK AUTO W/O MICRO         S/p left pyeloplasty 02/2015 for L UPJO.   Renal Scan 04/28/2018: Split function 50/50. T 1/2 3 minutes on the left. Asymptomatic.        PLAN:     Follow up PRN.            Chief Complaint       Patient presents with        ?  UPJ Obstruction        HISTORY OF PRESENT ILLNESS:   Kim [name] is a 23 y.o. Caucasian  female who presents in follow up for her left UPJO. Most recent Renal Scan 04/28/2018 demonstrated a good post-Lasix T1/2 time of 3 mins in the left kidney.      Patient denies any bothersome flank pain. No exacerbation with fluids. She has no complaints today.      Review of Systems   Constitutional: Fever: No   Skin: Rash: No   HEENT: Hearing difficulty: No   Eyes: Blurred vision: No   Cardiovascular: Chest pain: No   Respiratory: Shortness of breath: No   Gastrointestinal: Nausea/vomiting: No   Musculoskeletal: Back pain: No   Neurological: Weakness: No   Psychological: Memory loss: No   Comments/additional findings:         Past Medical History:        Diagnosis  Date         ?  Chronic kidney disease            congenital left hydronephrosis         ?  Hydronephrosis            congenital left side         ?  Reactive airway disease            Past Surgical History:         Procedure  Laterality  Date          ?  HX ORTHOPAEDIC         ?  HX OTHER SURGICAL              "clean out" bilaterally          ?  HX TONSIL AND ADENOIDECTOMY              Social History          Tobacco Use         ?  Smoking status:  Never Smoker     ?   Smokeless tobacco:  Never Used       Substance Use Topics         ?  Alcohol use:  No         ?  Drug use:  No        No Known Allergies        Family History         Problem  Relation  Age of Onset          ?  Cancer  Paternal Grandmother            ?  Cancer  Paternal Grandfather          PHYSICAL EXAMINATION:    Visit Vitals      BP  110/78     Ht  5\' 10"  (1.778 m)     Wt  187 lb 3.2 oz (84.9 kg)        BMI  26.86 kg/m??         GEN: NAD, alert and oriented   PULM: nl resp effort, no distress         REVIEW OF LABS AND IMAGING:       Results for orders placed or performed in visit on 05/25/18     AMB POC URINALYSIS DIP STICK AUTO W/O MICRO         Result  Value  Ref Range            Color (UA POC)  Yellow         Clarity (UA POC)  Clear         Glucose (UA POC)  Negative  Negative       Bilirubin (UA POC)  Negative  Negative       Ketones (UA POC)  Negative  Negative       Specific gravity (UA POC)  1.020  1.001 - 1.035       Blood (UA POC)  Negative  Negative       pH (UA POC)  7.0  4.6 - 8.0       Protein (UA POC)  Negative  Negative       Urobilinogen (UA POC)  0.2 mg/dL  0.2 - 1       Nitrites (UA POC)  Negative  Negative            Leukocyte esterase (UA POC)  Negative  Negative        Renal Scan 04/28/2018   IMPRESSION:   1. Delayed transit and excretion of the left kidney secondary to UPJ obstruction   2. Normal function of the right kidney.      CT A/P WO Cont 01/11/2018   IMPRESSION:   1. Borderline size mesenteric lymph nodes. Question mesenteric adenitis. No significant change from prior study.   2. Minimal free pelvic fluid decreased from prior study.   3. No evidence of acute obstructive uropathy.      Renal US (03/27/2017)   Impression:   Left limited views due to bowel gas. No apparent hydronephrosis.  Right mild caliectasis superior.  Non-fully distended bladder.      Lasix Renal Scan (04/04/2016):  IMPRESSION:   1. Obstruction of the left kidney which after administration of Lasix shows good    emptying of the left renal collecting system indicating improvement from the   previous examination.   2. Right kidney demonstrates mild delay and moderate decreased excretion similar   to previous study. Normal washout with Lasix. No obstruction      Imaging Report Reviewed?  yes   Type: Renal Scan   Images Reviewed?  yes     Type: Renal scan   ??   Other Lab Data Reviewed?   yes - UA      Ruben Reason, MD   [facility]      CC: Benay Spice, MD       Patient's BMI is out of the normal parameters.  Information about BMI was given and patient was advised to follow-up with their PCP for further management.      Medical documentation entered into the chart by Lin Givens, medical scribe for Ruben Reason, MD on  05/25/2018.

## 2018-05-25 NOTE — Progress Notes (Signed)
Kim [name]  10/22/1995    ASSESSMENT:     ICD-10-CM ICD-9-CM    1. Ureteropelvic junction (UPJ) obstruction N13.5 593.4 AMB POC URINALYSIS DIP STICK AUTO W/O MICRO      S/p left pyeloplasty 02/2015 for L UPJO.  Renal Scan 04/28/2018: Split function 50/50. T 1/2 3 minutes on the left. Asymptomatic.      PLAN:    Follow up PRN.       Chief Complaint   Patient presents with   ??? UPJ Obstruction     HISTORY OF PRESENT ILLNESS:  Kim [name] is a 23 y.o. Caucasian female who presents in follow up for her left UPJO. Most recent Renal Scan 04/28/2018 demonstrated a good post-Lasix T1/2 time of 3 mins in the left kidney.    Patient denies any bothersome flank pain. No exacerbation with fluids. She has no complaints today.    Review of Systems  Constitutional: Fever: No  Skin: Rash: No  HEENT: Hearing difficulty: No  Eyes: Blurred vision: No  Cardiovascular: Chest pain: No  Respiratory: Shortness of breath: No  Gastrointestinal: Nausea/vomiting: No  Musculoskeletal: Back pain: No  Neurological: Weakness: No  Psychological: Memory loss: No  Comments/additional findings:     Past Medical History:   Diagnosis Date   ??? Chronic kidney disease     congenital left hydronephrosis   ??? Hydronephrosis     congenital left side   ??? Reactive airway disease      Past Surgical History:   Procedure Laterality Date   ??? HX ORTHOPAEDIC     ??? HX OTHER SURGICAL      "clean out" bilaterally   ??? HX TONSIL AND ADENOIDECTOMY       Social History     Tobacco Use   ??? Smoking status: Never Smoker   ??? Smokeless tobacco: Never Used   Substance Use Topics   ??? Alcohol use: No   ??? Drug use: No     No Known Allergies    Family History   Problem Relation Age of Onset   ??? Cancer Paternal Grandmother    ??? Cancer Paternal Grandfather      PHYSICAL EXAMINATION:   Visit Vitals  BP 110/78   Ht 5\' 10"  (1.778 m)   Wt 187 lb 3.2 oz (84.9 kg)   BMI 26.86 kg/m??      GEN: NAD, alert and oriented  PULM: nl resp effort, no distress      REVIEW OF LABS AND IMAGING:     Results for orders placed or performed in visit on 05/25/18   AMB POC URINALYSIS DIP STICK AUTO W/O MICRO   Result Value Ref Range    Color (UA POC) Yellow     Clarity (UA POC) Clear     Glucose (UA POC) Negative Negative    Bilirubin (UA POC) Negative Negative    Ketones (UA POC) Negative Negative    Specific gravity (UA POC) 1.020 1.001 - 1.035    Blood (UA POC) Negative Negative    pH (UA POC) 7.0 4.6 - 8.0    Protein (UA POC) Negative Negative    Urobilinogen (UA POC) 0.2 mg/dL 0.2 - 1    Nitrites (UA POC) Negative Negative    Leukocyte esterase (UA POC) Negative Negative     Renal Scan 04/28/2018  IMPRESSION:  1. Delayed transit and excretion of the left kidney secondary to UPJ obstruction  2. Normal function of the right kidney.    CT A/P WO  Cont 01/11/2018  IMPRESSION:  1. Borderline size mesenteric lymph nodes. Question mesenteric adenitis. No significant change from prior study.  2. Minimal free pelvic fluid decreased from prior study.  3. No evidence of acute obstructive uropathy.    Renal US (03/27/2017)  Impression:   Left limited views due to bowel gas. No apparent hydronephrosis.  Right mild caliectasis superior.  Non-fully distended bladder.    Lasix Renal Scan (04/04/2016):  IMPRESSION:  1. Obstruction of the left kidney which after administration of Lasix shows good  emptying of the left renal collecting system indicating improvement from the  previous examination.  2. Right kidney demonstrates mild delay and moderate decreased excretion similar  to previous study. Normal washout with Lasix. No obstruction    Imaging Report Reviewed?  yes   Type: Renal Scan  Images Reviewed?     yes     Type: Renal scan  ??  Other Lab Data Reviewed?   yes - UA    Ruben Reason, MD  [facility]    CC: Benay Spice, MD     Patient's BMI is out of the normal parameters.  Information about BMI was given and patient was advised to follow-up with their PCP for further management.     Medical documentation entered into the chart by Lin Givens, medical scribe for Ruben Reason, MD on 05/25/2018.

## 2018-05-27 NOTE — Anesthesia Pre-Procedure Evaluation (Signed)
Relevant Problems   No relevant active problems       Anesthetic History   No history of anesthetic complications            Review of Systems / Medical History  Patient summary reviewed, nursing notes reviewed and pertinent labs reviewed    Pulmonary  Within defined limits                 Neuro/Psych   Within defined limits           Cardiovascular  Within defined limits                     GI/Hepatic/Renal         Renal disease      Comments: Congenital L hydronephrosis Endo/Other  Within defined limits           Other Findings   Comments: Hgb 14         Physical Exam    Airway  Mallampati: I  TM Distance: > 6 cm  Neck ROM: normal range of motion        Cardiovascular    Rhythm: regular  Rate: normal         Dental  No notable dental hx       Pulmonary  Breath sounds clear to auscultation               Abdominal  GI exam deferred       Other Findings            Anesthetic Plan    ASA: 1  Anesthesia type: general          Induction: Intravenous  Anesthetic plan and risks discussed with: Patient

## 2018-05-27 NOTE — Anesthesia Pre-Procedure Evaluation (Addendum)
Relevant Problems   No relevant active problems       Anesthetic History   No history of anesthetic complications            Review of Systems / Medical History  Patient summary reviewed, nursing notes reviewed and pertinent labs reviewed    Pulmonary  Within defined limits                 Neuro/Psych   Within defined limits           Cardiovascular  Within defined limits                     GI/Hepatic/Renal         Renal disease      Comments: Congenital L hydronephrosis Endo/Other  Within defined limits           Other Findings   Comments: Hgb 14         Physical Exam    Airway  Mallampati: I  TM Distance: > 6 cm  Neck ROM: normal range of motion        Cardiovascular    Rhythm: regular  Rate: normal         Dental  No notable dental hx       Pulmonary  Breath sounds clear to auscultation               Abdominal  GI exam deferred       Other Findings            Anesthetic Plan    ASA: 1  Anesthesia type: general          Induction: Intravenous  Anesthetic plan and risks discussed with: Patient

## 2018-05-28 ENCOUNTER — Inpatient Hospital Stay: Payer: PRIVATE HEALTH INSURANCE

## 2018-05-28 LAB — POC HCG,URINE
HCG urine, QL: NEGATIVE
Pregnancy Test(Urn): NEGATIVE

## 2018-05-28 MED ORDER — FENTANYL CITRATE (PF) 50 MCG/ML IJ SOLN
50 mcg/mL | INTRAMUSCULAR | Status: DC | PRN
Start: 2018-05-28 — End: 2018-05-28
  Administered 2018-05-28 (×2): via INTRAVENOUS

## 2018-05-28 MED ORDER — LIDOCAINE (PF) 5 MG/ML (0.5 %) IJ SOLN
5 mg/mL (0. %) | INTRAMUSCULAR | Status: DC | PRN
Start: 2018-05-28 — End: 2018-05-28
  Administered 2018-05-28: 18:00:00 via INTRAVENOUS

## 2018-05-28 MED ORDER — OXYCODONE 5 MG TAB
5 mg | Freq: Once | ORAL | Status: AC | PRN
Start: 2018-05-28 — End: 2018-05-28
  Administered 2018-05-28: 20:00:00 via ORAL

## 2018-05-28 MED ORDER — LABETALOL 5 MG/ML IV SOLN
5 mg/mL | INTRAVENOUS | Status: DC | PRN
Start: 2018-05-28 — End: 2018-05-28

## 2018-05-28 MED ORDER — OCTYL 2-CYANOACRYLATE TOPICAL LIQUID
CUTANEOUS | Status: AC
Start: 2018-05-28 — End: ?

## 2018-05-28 MED ORDER — SILVER NITRATE APPLICATORS 75 %-25 % TOPICAL STICK
75-25 % | CUTANEOUS | Status: AC
Start: 2018-05-28 — End: ?

## 2018-05-28 MED ORDER — PROPOFOL 10 MG/ML IV EMUL
10 mg/mL | INTRAVENOUS | Status: DC | PRN
Start: 2018-05-28 — End: 2018-05-28
  Administered 2018-05-28: 18:00:00 via INTRAVENOUS

## 2018-05-28 MED ORDER — BUPIVACAINE (PF) 0.25 % (2.5 MG/ML) IJ SOLN
0.25 % (2.5 mg/mL) | INTRAMUSCULAR | Status: AC
Start: 2018-05-28 — End: ?

## 2018-05-28 MED ORDER — LIDOCAINE HCL 1 % (10 MG/ML) IJ SOLN
10 mg/mL (1 %) | Freq: Once | INTRAMUSCULAR | Status: DC | PRN
Start: 2018-05-28 — End: 2018-05-28

## 2018-05-28 MED ORDER — BUPIVACAINE (PF) 0.25 % (2.5 MG/ML) IJ SOLN
0.25 % (2.5 mg/mL) | INTRAMUSCULAR | Status: DC | PRN
Start: 2018-05-28 — End: 2018-05-28
  Administered 2018-05-28: 18:00:00 via SUBCUTANEOUS

## 2018-05-28 MED ORDER — FLUMAZENIL 0.1 MG/ML IV SOLN
0.1 mg/mL | INTRAVENOUS | Status: DC | PRN
Start: 2018-05-28 — End: 2018-05-28

## 2018-05-28 MED ORDER — SILVER NITRATE APPLICATORS 75 %-25 % TOPICAL STICK
75-25 % | CUTANEOUS | Status: DC | PRN
Start: 2018-05-28 — End: 2018-05-28
  Administered 2018-05-28: 18:00:00 via TOPICAL

## 2018-05-28 MED ORDER — DEXAMETHASONE SODIUM PHOSPHATE 4 MG/ML IJ SOLN
4 mg/mL | INTRAMUSCULAR | Status: DC | PRN
Start: 2018-05-28 — End: 2018-05-28
  Administered 2018-05-28: 18:00:00 via INTRAVENOUS

## 2018-05-28 MED ORDER — HYDROMORPHONE 1 MG/ML INJECTION SOLUTION
1 mg/mL | INTRAMUSCULAR | Status: DC | PRN
Start: 2018-05-28 — End: 2018-05-28
  Administered 2018-05-28: 19:00:00 via INTRAVENOUS

## 2018-05-28 MED ORDER — PROMETHAZINE IN NS 6.25 MG/50 ML IV PIGGY BAG
6.25 mg/50 ml | INTRAVENOUS | Status: DC | PRN
Start: 2018-05-28 — End: 2018-05-28

## 2018-05-28 MED ORDER — FENTANYL CITRATE (PF) 50 MCG/ML IJ SOLN
50 mcg/mL | INTRAMUSCULAR | Status: DC | PRN
Start: 2018-05-28 — End: 2018-05-28
  Administered 2018-05-28 (×2): via INTRAVENOUS

## 2018-05-28 MED ORDER — NALOXONE 0.4 MG/ML INJECTION
0.4 mg/mL | INTRAMUSCULAR | Status: DC | PRN
Start: 2018-05-28 — End: 2018-05-28

## 2018-05-28 MED ORDER — SODIUM CHLORIDE 0.9 % IJ SYRG
INTRAMUSCULAR | Status: DC | PRN
Start: 2018-05-28 — End: 2018-05-28

## 2018-05-28 MED ORDER — LIDOCAINE (PF) 20 MG/ML (2 %) IJ SOLN
20 mg/mL (2 %) | INTRAMUSCULAR | Status: DC | PRN
Start: 2018-05-28 — End: 2018-05-28
  Administered 2018-05-28: 18:00:00 via INTRAVENOUS

## 2018-05-28 MED ORDER — FENTANYL CITRATE (PF) 50 MCG/ML IJ SOLN
50 mcg/mL | INTRAMUSCULAR | Status: AC
Start: 2018-05-28 — End: ?

## 2018-05-28 MED ORDER — ONDANSETRON (PF) 4 MG/2 ML INJECTION
4 mg/2 mL | INTRAMUSCULAR | Status: DC | PRN
Start: 2018-05-28 — End: 2018-05-28
  Administered 2018-05-28: 18:00:00 via INTRAVENOUS

## 2018-05-28 MED ORDER — ONDANSETRON (PF) 4 MG/2 ML INJECTION
4 mg/2 mL | Freq: Four times a day (QID) | INTRAMUSCULAR | Status: DC | PRN
Start: 2018-05-28 — End: 2018-05-28

## 2018-05-28 MED ORDER — IBUPROFEN 600 MG TAB
600 mg | Freq: Four times a day (QID) | ORAL | Status: DC | PRN
Start: 2018-05-28 — End: 2018-05-28

## 2018-05-28 MED ORDER — HYDROMORPHONE (PF) 2 MG/ML IJ SOLN
2 mg/mL | INTRAMUSCULAR | Status: DC | PRN
Start: 2018-05-28 — End: 2018-05-28
  Administered 2018-05-28 (×2): via INTRAVENOUS

## 2018-05-28 MED ORDER — HYDRALAZINE 20 MG/ML IJ SOLN
20 mg/mL | INTRAMUSCULAR | Status: DC | PRN
Start: 2018-05-28 — End: 2018-05-28

## 2018-05-28 MED ORDER — CEFAZOLIN 2 GRAM/50 ML NS IVPB
INTRAVENOUS | Status: AC
Start: 2018-05-28 — End: 2018-05-28
  Administered 2018-05-28: 17:00:00 via INTRAVENOUS

## 2018-05-28 MED ORDER — HYDROMORPHONE 1 MG/ML INJECTION SOLUTION
1 mg/mL | INTRAMUSCULAR | Status: AC
Start: 2018-05-28 — End: ?

## 2018-05-28 MED ORDER — OXYCODONE-ACETAMINOPHEN 5 MG-325 MG TAB
5-325 mg | Freq: Once | ORAL | Status: DC | PRN
Start: 2018-05-28 — End: 2018-05-28

## 2018-05-28 MED ORDER — FERRIC SUBSULFATE 0.2 GRAM TO 0.22 GRAM/ML TOPICAL SOLN AND APPLICATOR
0.2 to 2 gram/mL | CUTANEOUS | Status: AC
Start: 2018-05-28 — End: ?

## 2018-05-28 MED ORDER — MIDAZOLAM 1 MG/ML IJ SOLN
1 mg/mL | INTRAMUSCULAR | Status: DC | PRN
Start: 2018-05-28 — End: 2018-05-28
  Administered 2018-05-28: 17:00:00 via INTRAVENOUS

## 2018-05-28 MED ORDER — SUGAMMADEX 100 MG/ML INTRAVENOUS SOLUTION
100 mg/mL | INTRAVENOUS | Status: DC | PRN
Start: 2018-05-28 — End: 2018-05-28
  Administered 2018-05-28 (×2): via INTRAVENOUS

## 2018-05-28 MED ORDER — CONJUGATED ESTROGENS 0.625 MG/G VAGINAL CREAM
0.625 mg/gram | VAGINAL | Status: DC | PRN
Start: 2018-05-28 — End: 2018-05-28
  Administered 2018-05-28: 19:00:00 via VAGINAL

## 2018-05-28 MED ORDER — CONJUGATED ESTROGENS 0.625 MG/G VAGINAL CREAM
0.625 mg/gram | VAGINAL | Status: AC
Start: 2018-05-28 — End: ?

## 2018-05-28 MED ORDER — GLYCOPYRROLATE 0.2 MG/ML IJ SOLN
0.2 mg/mL | INTRAMUSCULAR | Status: DC | PRN
Start: 2018-05-28 — End: 2018-05-28
  Administered 2018-05-28: 17:00:00 via INTRAVENOUS

## 2018-05-28 MED ORDER — LACTATED RINGERS IV
INTRAVENOUS | Status: DC
Start: 2018-05-28 — End: 2018-05-28
  Administered 2018-05-28: 16:00:00 via INTRAVENOUS

## 2018-05-28 MED ORDER — MIDAZOLAM 1 MG/ML IJ SOLN
1 mg/mL | INTRAMUSCULAR | Status: AC
Start: 2018-05-28 — End: ?

## 2018-05-28 MED FILL — FENTANYL CITRATE (PF) 50 MCG/ML IJ SOLN: 50 mcg/mL | INTRAMUSCULAR | Qty: 2

## 2018-05-28 MED FILL — LACTATED RINGERS IV: INTRAVENOUS | Qty: 1000

## 2018-05-28 MED FILL — BUPIVACAINE (PF) 0.25 % (2.5 MG/ML) IJ SOLN: 0.25 % (2.5 mg/mL) | INTRAMUSCULAR | Qty: 30

## 2018-05-28 MED FILL — PREMARIN 0.625 MG/GRAM VAGINAL CREAM: 0.625 mg/gram | VAGINAL | Qty: 30

## 2018-05-28 MED FILL — MIDAZOLAM 1 MG/ML IJ SOLN: 1 mg/mL | INTRAMUSCULAR | Qty: 2

## 2018-05-28 MED FILL — MONSEL'S 0.2 GRAM TO 0.22 GRAM/ML TOPICAL SOLUTION WITH APPLICATOR: 0.2 to 2 gram/mL | CUTANEOUS | Qty: 8

## 2018-05-28 MED FILL — HYDROMORPHONE 1 MG/ML INJECTION SOLUTION: 1 mg/mL | INTRAMUSCULAR | Qty: 1

## 2018-05-28 MED FILL — OXYCODONE 5 MG TAB: 5 mg | ORAL | Qty: 1

## 2018-05-28 MED FILL — SILVER NITRATE APPLICATORS 75 %-25 % TOPICAL STICK: 75-25 % | CUTANEOUS | Qty: 1

## 2018-05-28 MED FILL — OCTYLSEAL TOPICAL LIQUID: CUTANEOUS | Qty: 6

## 2018-05-28 MED FILL — CEFAZOLIN 2 GRAM/50 ML NS IVPB: INTRAVENOUS | Qty: 50

## 2018-05-28 NOTE — Op Note (Signed)
Outpatient Operative Note     Surgeon(s): Etta Grandchild, MD  Assistant: Dannielle Huh McDonell  Pre-operative Diagnosis: Pelvic pain, endometrial polyp, dysmenorrhea  Post-operative Diagnosis: same as preop diagnosis.  Procedure(s) Performed: Diagnostic Laparoscopy, Hysteroscopy myosure polypectomy, Mirena IUD insertion                     Anesthesia: General  Findings: possible pelvic floor congestion, endometrial polyp  Complications: none   Estimated Blood Loss: 20  Specimens: endometrial polyp    Procedure:   Patient was taken to the OR after informed consent had been obtained. Surgery identification was completed with the patient and the OR team. She was then placed under general anesthesia, prepped and draped in a sterile fashion. A foley catheter was placed in her bladder.  Patient identification and surgery identification were completed with the surgeon and the OR team. Attention was turned to the vagina and a sponge stick was placed. Attention was then turned to the abdomen. 10 cc of 0.25% Marcaine was injected into the umbilicus and a 5 mm incision was made. A Veress needle was placed with anterior tenting of the abdominal wall. Pneumoperitoneum was achieved with CO2 gas with a starting pressure of 5. An OptiView port was placed under direct visualization without complication.  High-flow insufflation was initiated and the patient was placed in steep [place].  Left lower quadrant trocars were then placed by first injecting 0.25 % Marcaine and making a 5 mm incision. A 5 mm non-bladed trocar was inserted under laparoscopic visualization without injury to the underlying contents. The abdomen was examined and images were obtained.The instruments were removed from the abdomen. The skin was closed with 4-0 Monocryl in a subcuticlar fashion and steri-strips were placed over the incisions. The sponge stick was removed vaginally.  A bivalve speculum was introduced into the vagina and the cervix was visualized.  The anterior portion of the cervix was grasp with an Allis clamp. The uterus was sounded to 7 cm. The cervix was serially dilated to allow passage of a 5 mm diagnostic hysteroscope. Hysteroscopy was performed with the above noted findings. The Myosure morcellator was used to morcellate the endometrial polyp. Hysteroscope was removed. Hysteroscopy fluid deficit was . The Mirena IUD aparatus was inserted into the uterus and IUD was deployed. The IUD strings were trimmed. The speculum and Allis clamp were removed. The introitus of the the vagina appeared to have some bleeding, silver nitrate was used for hemostasis. And some vaginal packing was placed to aid in hemostasis and would be removed in PACU.     The foley catheter was removed from her bladder. Sponge, lap, needle and instrument counts were correct x 2. The patient was awakened and returned to recovery in stable condition.     Etta Grandchild, MD  May 28, 2018

## 2018-05-28 NOTE — Anesthesia Post-Procedure Evaluation (Signed)
Procedure(s):  DIAGNOSTIC LAPAROSCOPY;  HYSTEROSCOPY; POLYPECTOMY; MIRENA INTRUTERINE DEVICE INSERT.    general    Anesthesia Post Evaluation      Multimodal analgesia: multimodal analgesia used between 6 hours prior to anesthesia start to PACU discharge  Patient location during evaluation: PACU  Patient participation: complete - patient participated  Level of consciousness: awake  Pain score: 5  Airway patency: patent  Anesthetic complications: no  Cardiovascular status: stable  Respiratory status: room air  Hydration status: stable  Comments: Pt reportring cramping. Addind additional iv pain medications to improve pain score  Post anesthesia nausea and vomiting:  none      Vitals Value Taken Time   BP 143/70 05/28/2018  2:31 PM   Temp     Pulse 76 05/28/2018  2:37 PM   Resp 12 05/28/2018  2:37 PM   SpO2 96 % 05/28/2018  2:37 PM   Vitals shown include unvalidated device data.

## 2018-05-28 NOTE — Interval H&P Note (Signed)
Dr Zenaida Niece at bedside to remove vaginal packing

## 2018-05-28 NOTE — Other (Signed)
Dr Rosado Torres at bedside to remove vaginal packing

## 2018-05-28 NOTE — Anesthesia Post-Procedure Evaluation (Signed)
Procedure(s):  DIAGNOSTIC LAPAROSCOPY;  HYSTEROSCOPY; POLYPECTOMY; MIRENA INTRUTERINE DEVICE INSERT.    general    Anesthesia Post Evaluation      Multimodal analgesia: multimodal analgesia used between 6 hours prior to anesthesia start to PACU discharge  Patient location during evaluation: PACU  Patient participation: complete - patient participated  Level of consciousness: awake  Pain score: 5  Airway patency: patent  Anesthetic complications: no  Cardiovascular status: stable  Respiratory status: room air  Hydration status: stable  Comments: Pt reportring cramping. Addind additional iv pain medications to improve pain score  Post anesthesia nausea and vomiting:  none      Vitals Value Taken Time   BP 143/70 05/28/2018  2:31 PM   Temp     Pulse 76 05/28/2018  2:37 PM   Resp 12 05/28/2018  2:37 PM   SpO2 96 % 05/28/2018  2:37 PM   Vitals shown include unvalidated device data.

## 2018-05-28 NOTE — Op Note (Signed)
Outpatient Operative Note     Surgeon(s): Etta Grandchild, MD  Assistant: Dannielle Huh McDonell  Pre-operative Diagnosis: Pelvic pain, endometrial polyp, dysmenorrhea  Post-operative Diagnosis: same as preop diagnosis.  Procedure(s) Performed: Diagnostic Laparoscopy, Hysteroscopy myosure polypectomy, Mirena IUD insertion                     Anesthesia: General  Findings: possible pelvic floor congestion, endometrial polyp  Complications: none   Estimated Blood Loss: 20  Specimens: endometrial polyp    Procedure:   Patient was taken to the OR after informed consent had been obtained. Surgery identification was completed with the patient and the OR team. She was then placed under general anesthesia, prepped and draped in a sterile fashion. A foley catheter was placed in her bladder.  Patient identification and surgery identification were completed with the surgeon and the OR team. Attention was turned to the vagina and a sponge stick was placed. Attention was then turned to the abdomen. 10 cc of 0.25% Marcaine was injected into the umbilicus and a 5 mm incision was made. A Veress needle was placed with anterior tenting of the abdominal wall. Pneumoperitoneum was achieved with CO2 gas with a starting pressure of 5. An OptiView port was placed under direct visualization without complication.  High-flow insufflation was initiated and the patient was placed in steep [place].  Left lower quadrant trocars were then placed by first injecting 0.25 % Marcaine and making a 5 mm incision. A 5 mm non-bladed trocar was inserted under laparoscopic visualization without injury to the underlying contents. The abdomen was examined and images were obtained.The instruments were removed from the abdomen. The skin was closed with 4-0 Monocryl in a subcuticlar fashion and steri-strips were placed over the incisions. The sponge stick was removed vaginally.  A bivalve speculum was introduced into the vagina and the cervix was  visualized. The anterior portion of the cervix was grasp with an Allis clamp. The uterus was sounded to 7 cm. The cervix was serially dilated to allow passage of a 5 mm diagnostic hysteroscope. Hysteroscopy was performed with the above noted findings. The Myosure morcellator was used to morcellate the endometrial polyp. Hysteroscope was removed. Hysteroscopy fluid deficit was . The Mirena IUD aparatus was inserted into the uterus and IUD was deployed. The IUD strings were trimmed. The speculum and Allis clamp were removed. The introitus of the the vagina appeared to have some bleeding, silver nitrate was used for hemostasis. And some vaginal packing was placed to aid in hemostasis and would be removed in PACU.     The foley catheter was removed from her bladder. Sponge, lap, needle and instrument counts were correct x 2. The patient was awakened and returned to recovery in stable condition.     Etta Grandchild, MD  May 28, 2018

## 2018-05-29 ENCOUNTER — Inpatient Hospital Stay
Admit: 2018-05-29 | Discharge: 2018-05-29 | Disposition: A | Discharge status: Home / Self Care | Payer: PRIVATE HEALTH INSURANCE | Attending: Emergency Medicine

## 2018-05-29 DIAGNOSIS — T7840XA Allergy, unspecified, initial encounter: Secondary | ICD-10-CM

## 2018-05-29 MED ORDER — PREDNISONE 20 MG TAB
20 mg | ORAL | Status: AC
Start: 2018-05-29 — End: 2018-05-29
  Administered 2018-05-29: 20:00:00 via ORAL

## 2018-05-29 MED ORDER — PREDNISONE 10 MG TABLETS IN A DOSE PACK
10 mg | ORAL_TABLET | ORAL | 0 refills | Status: DC
Start: 2018-05-29 — End: 2019-05-26

## 2018-05-29 MED ORDER — OXYCODONE-ACETAMINOPHEN 5 MG-325 MG TAB
5-325 mg | ORAL | Status: AC
Start: 2018-05-29 — End: 2018-05-29
  Administered 2018-05-29: 21:00:00 via ORAL

## 2018-05-29 MED ORDER — DIPHENHYDRAMINE 25 MG CAP
25 mg | ORAL | Status: AC
Start: 2018-05-29 — End: 2018-05-29
  Administered 2018-05-29: 20:00:00 via ORAL

## 2018-05-29 MED ORDER — FAMOTIDINE 20 MG TAB
20 mg | ORAL_TABLET | Freq: Two times a day (BID) | ORAL | 0 refills | Status: AC
Start: 2018-05-29 — End: 2018-06-08

## 2018-05-29 MED ORDER — FAMOTIDINE 20 MG TAB
20 mg | ORAL | Status: AC
Start: 2018-05-29 — End: 2018-05-29
  Administered 2018-05-29: 21:00:00 via ORAL

## 2018-05-29 MED FILL — DIPHENHYDRAMINE 25 MG CAP: 25 mg | ORAL | Qty: 1

## 2018-05-29 MED FILL — FAMOTIDINE 20 MG TAB: 20 mg | ORAL | Qty: 1

## 2018-05-29 MED FILL — OXYCODONE-ACETAMINOPHEN 5 MG-325 MG TAB: 5-325 mg | ORAL | Qty: 1

## 2018-05-29 MED FILL — PREDNISONE 20 MG TAB: 20 mg | ORAL | Qty: 3

## 2018-05-29 NOTE — ED Provider Notes (Signed)
ED Provider Notes by Everardo All, PA at 05/29/18 1448                Author: Everardo All, PA  Service: EMERGENCY  Author Type: Physician Assistant       Filed: 05/29/18 1656  Date of Service: 05/29/18 1448  Status: Attested           Editor: Everardo All, PA (Physician Assistant)  Cosigner: Rolena Infante, MD at 05/29/18 2318          Attestation signed by Rolena Infante, MD at 05/29/18 2318          I, Rolena Infante, MD , have personally seen and examined this patient; I have fully participated in the care of this patient with the advanced practice  provider.  I have reviewed and agree with all pertinent clinical information including history, physical exam, labs, radiographic studies and the plan.  I have also reviewed and agree with the medications, allergies and past medical history sections for  this patient.  I do think this was an allergic reaction, she does have some redness of her cheeks, no respiratory distress, hives, difficulty swallowing, wheezing, or any other allergic symptoms.  She is given prednisone and Benadryl.      Rolena Infante, MD   May 29, 2018                                    [facility]   Emergency Department Treatment Report          Patient: Kim [name]  Age: 23 y.o.  Sex: female          Date of Birth: 18-Feb-1996  Admit Date: 05/29/2018  PCP: Benay Spice, MD     MRN: [id number]   CSN: [id number]            Room: ER22/ER22  Time Dictated: 2:48 PM          Attending Physician: Rolena Infante, MD   Physician Assistant: Asencion Islam      Chief Complaint      Chief Complaint       Patient presents with        ?  Allergic Reaction        ?  Post OP Complication             History of Present Illness     23 y.o. female  who presents to the ED for evaluation of a possible allergic reaction.  Around 11 AM she felt her face swell and flushed, she felt like her throat was swelling.  She had difficulty swallowing.  Denies  vomiting, diarrhea, rashes.  Denies new foods, soaps,  detergents.  She took Motrin and Percocet earlier today.  She has had these medications before.  She states the only thing she can think of is that she took Gas-X.        Review of Systems     Constitutional: No fever or chills   Eyes: No visual symptoms   ENT: Positive for throat swelling.  No ear pain   Respiratory: No cough or shortness or breath   Cardiovascular: No chest pain   Gastrointestinal: Positive for abdominal pain.  No nausea or vomiting   Genitourinary: No dysuria or hematuria   Musculoskeletal: No swelling   Integumentary: No rashes   Neurological: No headaches  Psychiatric: No HI or SI        Past Medical/Surgical History          Past Medical History:        Diagnosis  Date         ?  Bronchitis, chronic (HCC)       ?  Chronic kidney disease            congenital left hydronephrosis         ?  Hydronephrosis            congenital left side         ?  Irregular menses       ?  Pelvic pain           ?  Reactive airway disease            Past Surgical History:         Procedure  Laterality  Date          ?  HX ORTHOPAEDIC  Bilateral            knee surgery          ?  HX OTHER SURGICAL              "clean out" bilaterally          ?  HX TONSIL AND ADENOIDECTOMY                 Social History          Social History          Socioeconomic History         ?  Marital status:  SINGLE              Spouse name:  Not on file         ?  Number of children:  Not on file     ?  Years of education:  Not on file     ?  Highest education level:  Not on file       Tobacco Use         ?  Smoking status:  Never Smoker     ?  Smokeless tobacco:  Never Used       Substance and Sexual Activity         ?  Alcohol use:  No         ?  Drug use:  No             Family History          Family History         Problem  Relation  Age of Onset          ?  Cancer  Paternal Grandmother            ?  Cancer  Paternal Grandfather               Home Medications          Prior  to Admission Medications     Prescriptions  Last Dose  Informant  Patient Reported?  Taking?      albuterol (PROVENTIL HFA, VENTOLIN HFA, PROAIR HFA) 90 mcg/actuation inhaler      Yes  No      Sig: Take  by inhalation.               Facility-Administered Medications: None  Allergies     No Known Allergies        Physical Exam        Visit Vitals      BP  124/72 (BP 1 Location: Right arm, BP Patient Position: Sitting)     Pulse  79     Temp  97.9 ??F (36.6 ??C)     Resp  20         LMP  05/16/2018 (Approximate)  Comment: negative pregnancy test 2/13        SpO2  98%        General appearance: Well developed, well nourished female  sitting up in bed and appears uncomfortable.  Family at bedside   Eyes: Conjunctivae clear, non-icteric   ENT: Mouth/Throat: Surfaces pharynx, palate, and tongue are pink, moist and without lesions.  Uvula midline.  No stridor or drooling.  Normal  phonation.  Tolerating secretions.  Patient's face appears flushed   Respiratory: Clear to auscultation bilaterally   Cardiovascular: Regular, rate and rhythm   GI: Abdomen soft, nondistended, minimally diffusely tender with palpation.     Musculoskeletal: Patient able to move all four extremities. No peripheral edema   Skin: Warm and dry without rashes   Neurologic: Alert, oriented. Answers questions appropriately        Impression and Management Plan     This is a 23 y.o.  female who presents to the ED for possible allergic reaction.  Patient protecting her airway.  Will treat symptomatically.        Diagnostic Studies     Lab:    Labs Reviewed - No data to display      Imaging:     No results found.     ED Course     Patient remained stable in the ER.  Discussed with her and her parents findings on physical examination.  Patient protecting her airway.  She received symptomatic  treatment.  She also received a p.o. Percocet for pain.  I went to reassess the patient.  Patient resting comfortably.  She does not feel any different.  She  has had no itching, rash, throat swelling.  Low suspicion this is secondary to the Percocet.   Instructed patient to follow-up with Dr. Kris Hartmann.  Take medications as prescribed.  She may take over-the-counter Benadryl as needed for itching. Return to the ER if condition worsens or new symptoms develop.  Patient agreed to the plan and all of  her questions were answered        Medications       famotidine (PEPCID) tablet 20 mg (20 mg Oral Given 05/29/18 1601)     diphenhydrAMINE (BENADRYL) capsule 25 mg (25 mg Oral Given 05/29/18 1506)     predniSONE (DELTASONE) tablet 60 mg (60 mg Oral Given 05/29/18 1506)       oxyCODONE-acetaminophen (PERCOCET) 5-325 mg per tablet 1 Tab (1 Tab Oral Given 05/29/18 1601)          Final Diagnosis                 ICD-10-CM  ICD-9-CM          1.  Allergic reaction, initial encounter  T78.40XA  995.3          2.  Facial edema  R60.0  782.3          Disposition     Patient discharged home in stable condition with the instructions stated above. Prescription for prednisone and Pepcid given  The patient was personally evaluated by myself and MANOLIO, Ferdinand Lango, MD who agrees with the above assessment and plan         Asencion Islam, PA-C   May 29, 2018      [facility] medical dictation software was used for portions of this report. Unintended errors may occur.       My signature above authenticates this document and my orders, the final ??   diagnosis (es), discharge prescription (s), and instructions in the Epic ??   record.   If you have any questions please contact [phone].   ??   Nursing notes have been reviewed by the physician/ advanced practice ??   Clinician.

## 2018-05-29 NOTE — ED Notes (Signed)
Had diagnostic laparoscopy yesterday. Came in for possible allergic reaction with facial swelling and throat closing up at 1100. Had Ibuprofen at 0800 and Percocet at 0900.

## 2018-05-29 NOTE — ED Notes (Signed)
Went over discharge instructions with patient.  Patient given opportunity to ask questions.  Patient verbalized understanding with all instructions.

## 2018-05-29 NOTE — ED Provider Notes (Signed)
[facility]  Emergency Department Treatment Report    Patient: Kim [name] Age: 23 y.o. Sex: female    Date of Birth: Nov 26, 1995 Admit Date: 05/29/2018 PCP: Benay Spice, MD   MRN: [id number]  CSN: [id number]     Room: ER22/ER22 Time Dictated: 2:48 PM      Attending Physician: Rolena Infante, MD  Physician Assistant: Asencion Islam    Chief Complaint   Chief Complaint   Patient presents with   ??? Allergic Reaction   ??? Post OP Complication       History of Present Illness   23 y.o. female who presents to the ED for evaluation of a possible allergic reaction.  Around 11 AM she felt her face swell and flushed, she felt like her throat was swelling.  She had difficulty swallowing.  Denies vomiting, diarrhea, rashes.  Denies new foods, soaps, detergents.  She took Motrin and Percocet earlier today.  She has had these medications before.  She states the only thing she can think of is that she took Gas-X.    Review of Systems   Constitutional: No fever or chills  Eyes: No visual symptoms  ENT: Positive for throat swelling.  No ear pain  Respiratory: No cough or shortness or breath  Cardiovascular: No chest pain  Gastrointestinal: Positive for abdominal pain.  No nausea or vomiting  Genitourinary: No dysuria or hematuria  Musculoskeletal: No swelling  Integumentary: No rashes  Neurological: No headaches   Psychiatric: No HI or SI    Past Medical/Surgical History     Past Medical History:   Diagnosis Date   ??? Bronchitis, chronic (HCC)    ??? Chronic kidney disease     congenital left hydronephrosis   ??? Hydronephrosis     congenital left side   ??? Irregular menses    ??? Pelvic pain    ??? Reactive airway disease      Past Surgical History:   Procedure Laterality Date   ??? HX ORTHOPAEDIC Bilateral     knee surgery   ??? HX OTHER SURGICAL      "clean out" bilaterally   ??? HX TONSIL AND ADENOIDECTOMY         Social History     Social History     Socioeconomic History   ??? Marital status: SINGLE      Spouse name: Not on file   ??? Number of children: Not on file   ??? Years of education: Not on file   ??? Highest education level: Not on file   Tobacco Use   ??? Smoking status: Never Smoker   ??? Smokeless tobacco: Never Used   Substance and Sexual Activity   ??? Alcohol use: No   ??? Drug use: No       Family History     Family History   Problem Relation Age of Onset   ??? Cancer Paternal Grandmother    ??? Cancer Paternal Grandfather        Home Medications     Prior to Admission Medications   Prescriptions Last Dose Informant Patient Reported? Taking?   albuterol (PROVENTIL HFA, VENTOLIN HFA, PROAIR HFA) 90 mcg/actuation inhaler   Yes No   Sig: Take  by inhalation.      Facility-Administered Medications: None       Allergies   No Known Allergies    Physical Exam     Visit Vitals  BP 124/72 (BP 1 Location: Right arm, BP Patient Position: Sitting)  Pulse 79   Temp 97.9 ??F (36.6 ??C)   Resp 20   LMP 05/16/2018 (Approximate) Comment: negative pregnancy test 2/13   SpO2 98%     General appearance: Well developed, well nourished female sitting up in bed and appears uncomfortable.  Family at bedside  Eyes: Conjunctivae clear, non-icteric  ENT: Mouth/Throat: Surfaces pharynx, palate, and tongue are pink, moist and without lesions.  Uvula midline.  No stridor or drooling.  Normal phonation.  Tolerating secretions.  Patient's face appears flushed  Respiratory: Clear to auscultation bilaterally  Cardiovascular: Regular, rate and rhythm  GI: Abdomen soft, nondistended, minimally diffusely tender with palpation.    Musculoskeletal: Patient able to move all four extremities. No peripheral edema  Skin: Warm and dry without rashes  Neurologic: Alert, oriented. Answers questions appropriately    Impression and Management Plan   This is a 23 y.o. female who presents to the ED for possible allergic reaction.  Patient protecting her airway.  Will treat symptomatically.    Diagnostic Studies   Lab:   Labs Reviewed - No data to display     Imaging:    No results found.  ED Course   Patient remained stable in the ER.  Discussed with her and her parents findings on physical examination.  Patient protecting her airway.  She received symptomatic treatment.  She also received a p.o. Percocet for pain.  I went to reassess the patient.  Patient resting comfortably.  She does not feel any different.  She has had no itching, rash, throat swelling.  Low suspicion this is secondary to the Percocet.  Instructed patient to follow-up with Dr. Kris Hartmann.  Take medications as prescribed.  She may take over-the-counter Benadryl as needed for itching. Return to the ER if condition worsens or new symptoms develop.  Patient agreed to the plan and all of her questions were answered    Medications   famotidine (PEPCID) tablet 20 mg (20 mg Oral Given 05/29/18 1601)   diphenhydrAMINE (BENADRYL) capsule 25 mg (25 mg Oral Given 05/29/18 1506)   predniSONE (DELTASONE) tablet 60 mg (60 mg Oral Given 05/29/18 1506)   oxyCODONE-acetaminophen (PERCOCET) 5-325 mg per tablet 1 Tab (1 Tab Oral Given 05/29/18 1601)     Final Diagnosis       ICD-10-CM ICD-9-CM   1. Allergic reaction, initial encounter T78.40XA 995.3   2. Facial edema R60.0 782.3     Disposition   Patient discharged home in stable condition with the instructions stated above. Prescription for prednisone and Pepcid given        The patient was personally evaluated by myself and MANOLIO, Ferdinand Lango, MD who agrees with the above assessment and plan      Asencion Islam, PA-C  May 29, 2018    [facility] medical dictation software was used for portions of this report. Unintended errors may occur.     My signature above authenticates this document and my orders, the final ??  diagnosis (es), discharge prescription (s), and instructions in the Epic ??  record.  If you have any questions please contact [phone].  ??  Nursing notes have been reviewed by the physician/ advanced practice ??  Clinician.

## 2018-05-29 NOTE — ED Triage Notes (Signed)
Had diagnostic laparoscopy yesterday. Came in for possible allergic reaction with facial swelling and throat closing up at 1100. Had Ibuprofen at 0800 and Percocet at 0900.

## 2018-06-20 IMAGING — CR DG CHEST 2V
2 series · 2 of 2 positions shown · non-contrast
Comparison: None.

CLINICAL DATA: Dyspnea and hyperventilating after softball game.

EXAM:
CHEST  2 VIEW

[chest pa]
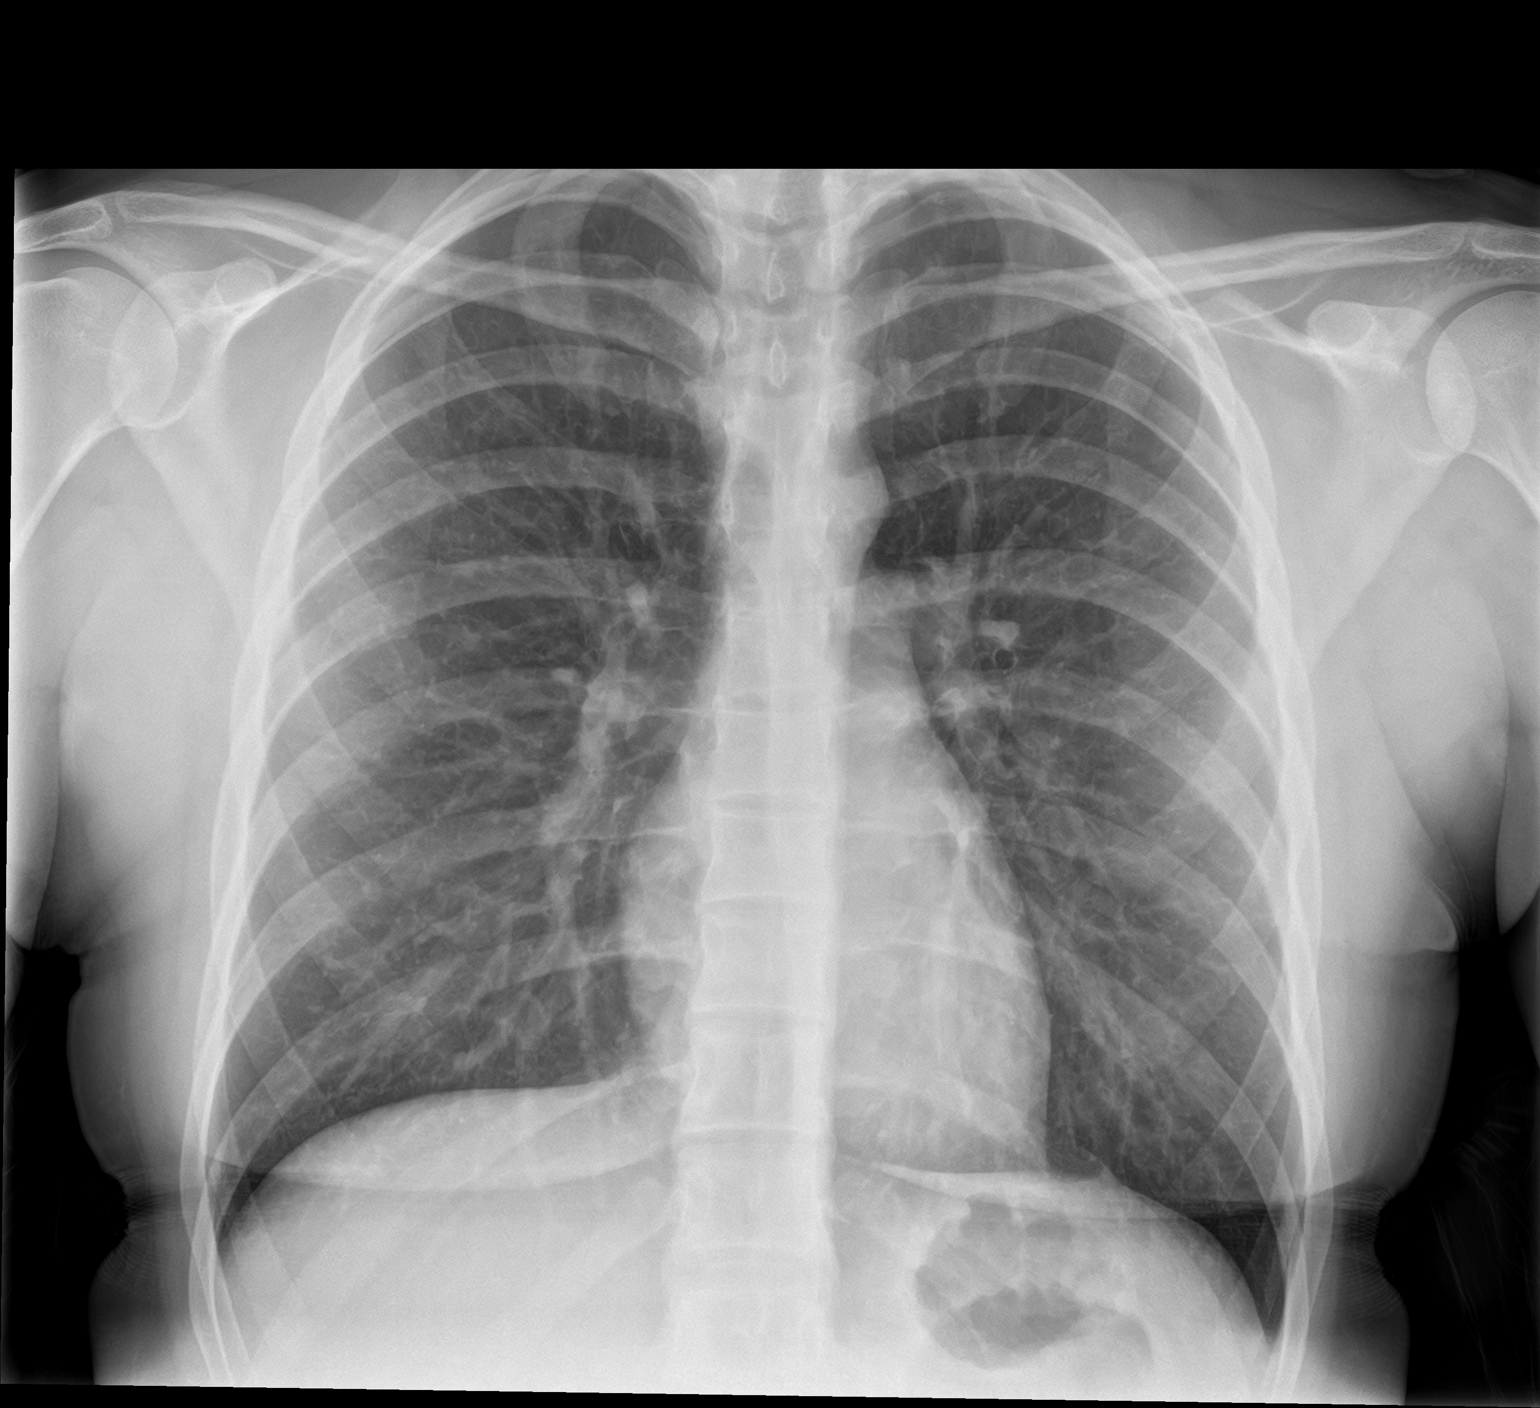

[chest lat]
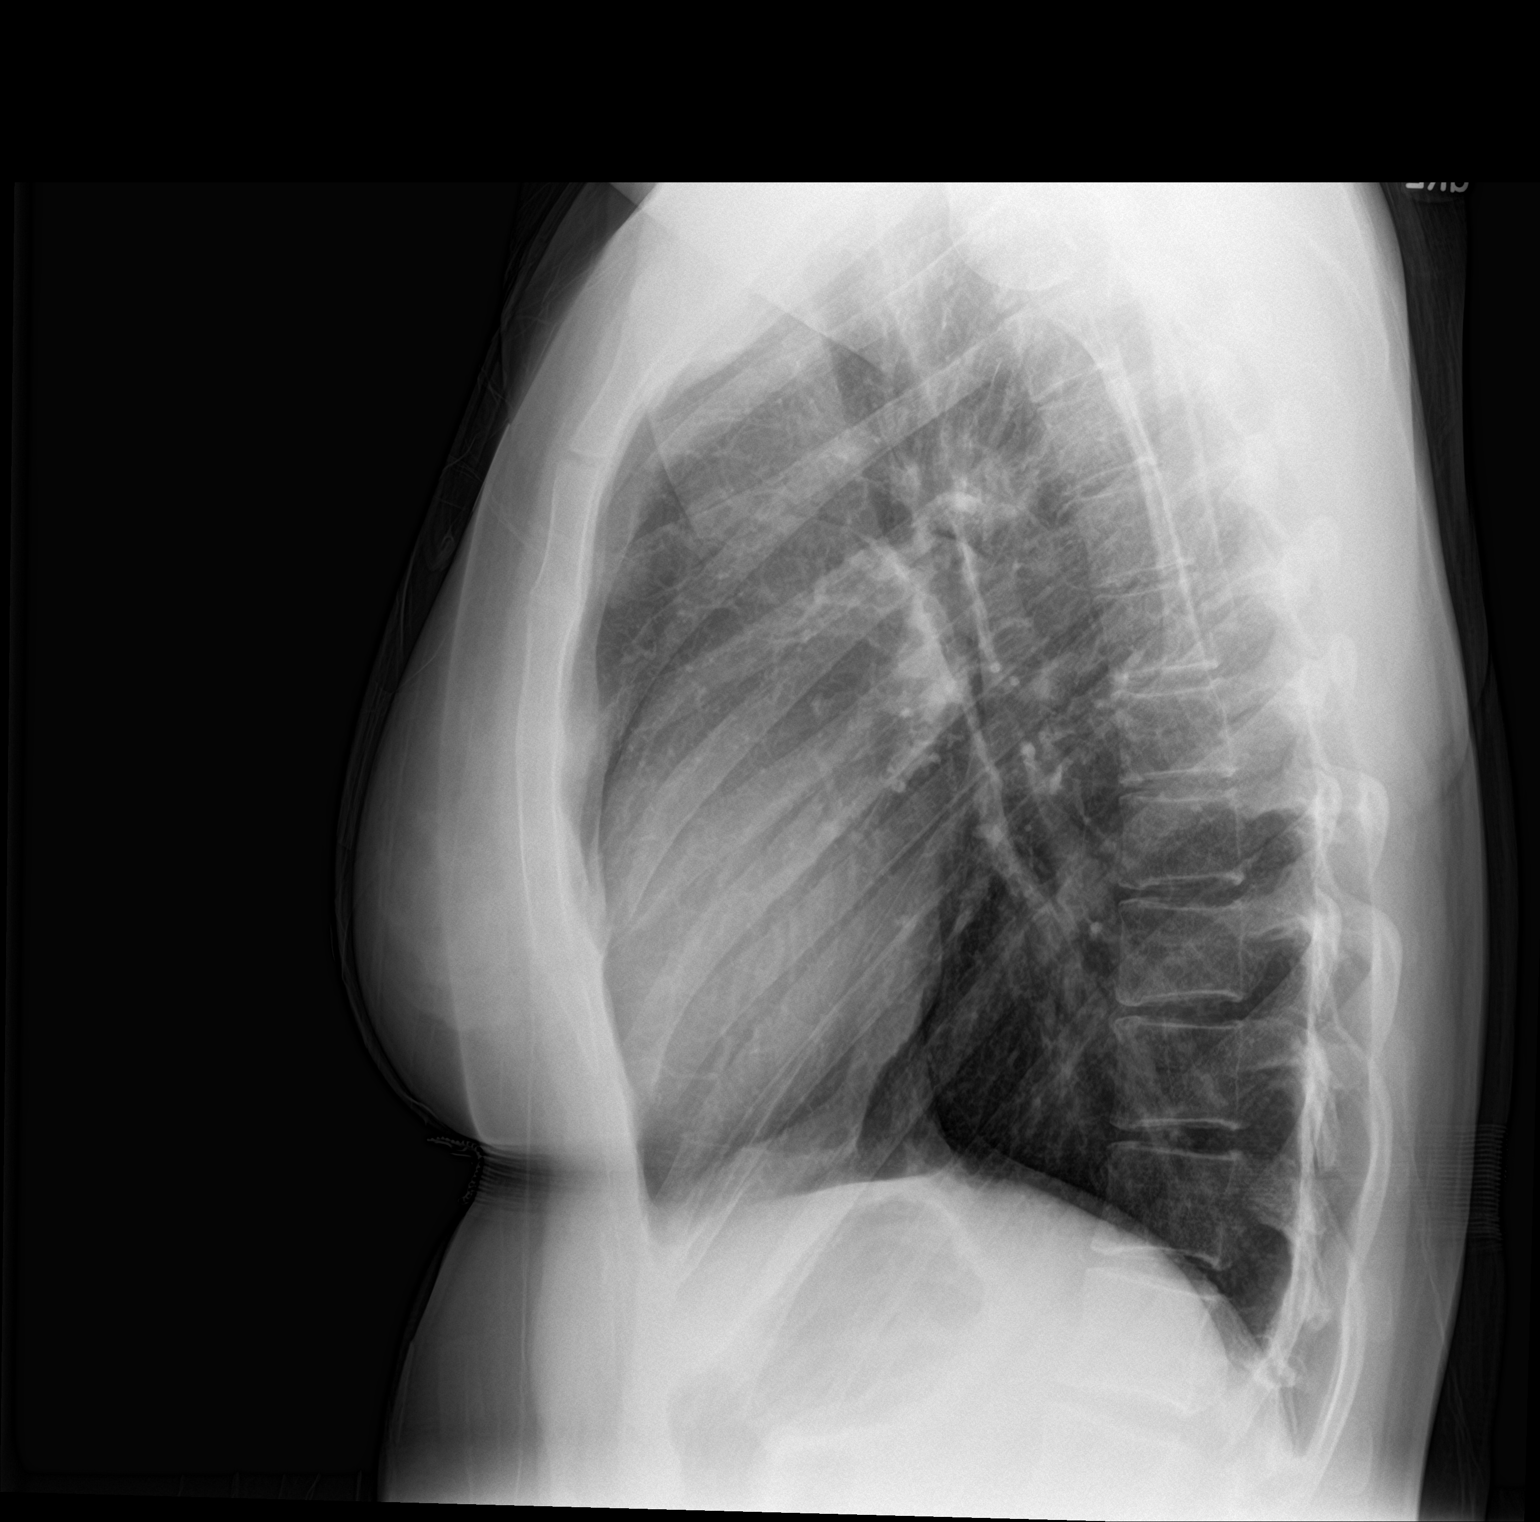

[2 of 2 positions shown; findings below may reference images not displayed]

FINDINGS: The heart size and mediastinal contours are within normal limits.
Both lungs are clear. The visualized skeletal structures are
unremarkable.
IMPRESSION: No active cardiopulmonary disease.

## 2018-12-10 ENCOUNTER — Inpatient Hospital Stay: Admit: 2018-12-10 | Payer: PRIVATE HEALTH INSURANCE | Primary: Family Medicine

## 2018-12-10 ENCOUNTER — Encounter

## 2018-12-10 DIAGNOSIS — M25531 Pain in right wrist: Secondary | ICD-10-CM

## 2019-03-15 DIAGNOSIS — Z1321 Encounter for screening for nutritional disorder: Secondary | ICD-10-CM

## 2019-03-16 ENCOUNTER — Inpatient Hospital Stay: Admit: 2019-03-16 | Payer: PRIVATE HEALTH INSURANCE | Primary: Family Medicine

## 2019-03-16 LAB — METABOLIC PANEL, BASIC
Anion gap: 6 mmol/L (ref 5–15)
BUN: 10 mg/dl (ref 7–25)
CO2: 26 mEq/L (ref 21–32)
Calcium: 9 mg/dl (ref 8.5–10.1)
Chloride: 108 mEq/L — ABNORMAL HIGH (ref 98–107)
Creatinine: 0.8 mg/dl (ref 0.6–1.3)
GFR est AA: >60
GFR est non-AA: >60
Glucose: 94 mg/dl (ref 74–106)
Potassium: 3.8 mEq/L (ref 3.5–5.1)
Sodium: 140 mEq/L (ref 136–145)

## 2019-03-16 LAB — CBC WITH AUTOMATED DIFF
BASOPHILS: 0.3 % (ref 0–3)
EOSINOPHILS: 2.3 % (ref 0–5)
HCT: 43.4 % (ref 37.0–50.0)
HGB: 14.5 gm/dl (ref 13.0–17.2)
IMMATURE GRANULOCYTES: 0.2 % (ref 0.0–3.0)
LYMPHOCYTES: 29.5 % (ref 28–48)
MCH: 29.6 pg (ref 25.4–34.6)
MCHC: 33.4 gm/dl (ref 30.0–36.0)
MCV: 88.6 fL (ref 80.0–98.0)
MONOCYTES: 6.7 % (ref 1–13)
MPV: 9.8 fL (ref 6.0–10.0)
NEUTROPHILS: 61 % (ref 34–64)
NRBC: 0 (ref 0–0)
PLATELET: 333 10*3/uL (ref 140–450)
RBC: 4.9 M/uL (ref 3.60–5.20)
RDW-SD: 41.1 (ref 36.4–46.3)
WBC: 8.9 10*3/uL (ref 4.0–11.0)

## 2019-03-16 LAB — TSH 3RD GENERATION
TSH: 1.98 u[IU]/mL (ref 0.358–3.740)
TSH: 1.98 u[IU]/mL (ref 0.358–3.740)

## 2019-03-16 LAB — PROGESTERONE
Progesterone: 4.87 ng/ml
Progesterone: 4.87 ng/ml

## 2019-03-16 LAB — BETA HCG, QT
HCG, BETA, HCGTLT: <1 m[IU]/mL
HCG, beta, QT: <1 m[IU]/mL

## 2019-03-16 LAB — ESTRADIOL
ESTRADIOL, ESTD: 80.5 pg/ml
Estradiol: 80.5 pg/ml

## 2019-03-16 LAB — BASIC METABOLIC PANEL
Anion Gap: 6 mmol/L (ref 5–15)
BUN: 10 mg/dl (ref 7–25)
CO2: 26 mEq/L (ref 21–32)
Calcium: 9 mg/dl (ref 8.5–10.1)
Chloride: 108 mEq/L — ABNORMAL HIGH (ref 98–107)
Creatinine: 0.8 mg/dl (ref 0.6–1.3)
EGFR IF NonAfrican American: >60
GFR African American: >60
Glucose: 94 mg/dl (ref 74–106)
Potassium: 3.8 mEq/L (ref 3.5–5.1)
Sodium: 140 mEq/L (ref 136–145)

## 2019-03-16 LAB — CBC WITH AUTO DIFFERENTIAL
Basophils %: 0.3 % (ref 0–3)
Eosinophils %: 2.3 % (ref 0–5)
Hematocrit: 43.4 % (ref 37.0–50.0)
Hemoglobin: 14.5 gm/dl (ref 13.0–17.2)
Immature Granulocytes: 0.2 % (ref 0.0–3.0)
Lymphocytes %: 29.5 % (ref 28–48)
MCH: 29.6 pg (ref 25.4–34.6)
MCHC: 33.4 gm/dl (ref 30.0–36.0)
MCV: 88.6 fL (ref 80.0–98.0)
MPV: 9.8 fL (ref 6.0–10.0)
Monocytes %: 6.7 % (ref 1–13)
Neutrophils %: 61 % (ref 34–64)
Nucleated RBCs: 0 (ref 0–0)
Platelets: 333 10*3/uL (ref 140–450)
RBC: 4.9 M/uL (ref 3.60–5.20)
RDW-SD: 41.1 (ref 36.4–46.3)
WBC: 8.9 10*3/uL (ref 4.0–11.0)

## 2019-03-17 LAB — HEPATITIS C AB
Hepatitis C virus Ab: NONREACTIVE
Signal to Cutoff (Hep C): 0

## 2019-03-17 LAB — HEP B SURFACE AG: Hepatitis B surface Ag: NONREACTIVE

## 2019-03-17 LAB — HEPATITIS C ANTIBODY
HCV Ab: NONREACTIVE
SIGNAL TO CUTOFF (HEP C): 0

## 2019-03-17 LAB — HEPATITIS B SURFACE ANTIGEN: Hepatitis B Surface Ag: NONREACTIVE

## 2019-03-29 DIAGNOSIS — Z113 Encounter for screening for infections with a predominantly sexual mode of transmission: Secondary | ICD-10-CM

## 2019-03-30 ENCOUNTER — Inpatient Hospital Stay: Admit: 2019-03-30 | Payer: PRIVATE HEALTH INSURANCE | Primary: Family Medicine

## 2019-04-01 LAB — C. TRACHOMATIS ABS, IGG/IGA/IGM
C. trachomatis Ab (IgA): <1:16 {titer}
C. trachomatis Ab (IgG): <1:64 {titer}
C. trachomatis Ab (IgM): <1:10 {titer}
Interpretation: NOT DETECTED

## 2019-04-05 ENCOUNTER — Encounter: Payer: PRIVATE HEALTH INSURANCE | Attending: Urology | Primary: Family Medicine

## 2019-04-20 ENCOUNTER — Ambulatory Visit: Attending: Urology | Primary: Family Medicine

## 2019-04-20 ENCOUNTER — Encounter: Payer: PRIVATE HEALTH INSURANCE | Attending: Urology | Primary: Family Medicine

## 2019-04-20 ENCOUNTER — Ambulatory Visit: Admit: 2019-04-20 | Discharge: 2019-04-20 | Attending: Urology | Primary: Family Medicine

## 2019-04-20 DIAGNOSIS — R102 Pelvic and perineal pain: Secondary | ICD-10-CM

## 2019-04-20 NOTE — Progress Notes (Signed)
Kim [name] has order for CT UROGRAM W WO CONT     To be done at [facility]    Needed by: 05/26/2019    Patient has a follow-up appointment: Yes, 05/26/2019    If MRI, does patient have a pacemaker: N/A    Order has been placed in connect care: Yes    Is this a STAT order:  No      [place]

## 2019-04-20 NOTE — Progress Notes (Signed)
Faxed imaging to [facility]; [facility] [phone]; CTU; 05/26/19 F/U

## 2019-04-20 NOTE — Progress Notes (Signed)
Progress Notes by Kim Gather, MD at 04/20/19 1400                Author: Vance Gather, MD  Service: --  Author Type: Physician       Filed: 04/20/19 1436  Encounter Date: 04/20/2019  Status: Signed          Editor: Kim Gather, MD (Physician)                       Kim [name]   1995-08-24      ASSESSMENT:              ICD-10-CM  ICD-9-CM             1.  Pelvic pain   R10.2  IMO0002  CT UROGRAM W WO CONT                REFERRAL TO PHYSICAL THERAPY ([facility])           2.  Left flank pain   R10.9  789.09  CT UROGRAM W WO CONT           3.  UPJ (ureteropelvic junction) obstruction   N13.5  593.4  CT UROGRAM W WO CONT            -Pelvic pain, chronic x1 yr.  Very bothersome.  Fruitless GYN eval.  No obvious cause for pain on CT 07/2018.  I told her repeat scan probably not going to reveal anything but I think it's worth double checking since symptoms are bothersome, persistent,  worse.  Will refer to pelvic floor PT in event that CT doesn't show cause for her symptoms.       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   Renal Scan 04/28/2018: Split function 50/50. T 1/2 3 minutes on the left.    CT A/P 07/2018: Pelviectasis and minimal caliectasis on the left suggests mild UPJ obstruction      PLAN:     CT Urogram ordered to further evaluate pain.    Refer to pelvic floor physical therapy for pelvic pain.    Follow up in 4 weeks to review CT.            Chief Complaint       Patient presents with        ?  UPJ Obstruction        ?  Abdominal Pain        HISTORY OF PRESENT ILLNESS:   Kim [name] is a 24 y.o. Caucasian  female who presents in follow up for pelvic pain and left flank pain.    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 Renal Scan 04/28/2018 demonstrated a good post-Lasix T1/2 time of 3 mins in the left kidney.      She reports worsening lower abdominal/pelvic pain and left flank pain for the last 18 months. Now having more frequent episodes of pain and effecting her daily  activities. severity of pain varies.  She has had a negative evaluation by GYN. She also reports  suprapubic/bladder pain after she finishes voiding. Denies dysuria. No gross hematuria. No f/c/n/v. No urinary frequency, urgency or incontinence.          Review of Systems   Constitutional: Fever: No   Skin: Rash: No   HEENT: Hearing difficulty: No   Eyes: Blurred vision: No   Cardiovascular: Chest pain: No   Respiratory: Shortness  of breath: No   Gastrointestinal: Nausea/vomiting: No   Musculoskeletal: Back pain: No   Neurological: Weakness: No   Psychological: Memory loss: No   Comments/additional findings:         Past Medical History:        Diagnosis  Date         ?  Bronchitis, chronic (HCC)       ?  Chronic kidney disease            congenital left hydronephrosis         ?  Hydronephrosis            congenital left side         ?  Irregular menses       ?  Pelvic pain           ?  Reactive airway disease            Past Surgical History:         Procedure  Laterality  Date          ?  HX ORTHOPAEDIC  Bilateral            knee surgery          ?  HX OTHER SURGICAL              "clean out" bilaterally          ?  HX PELVIC LAPAROSCOPY              ?  HX TONSIL AND ADENOIDECTOMY              Social History          Tobacco Use         ?  Smoking status:  Never Smoker     ?  Smokeless tobacco:  Never Used       Substance Use Topics         ?  Alcohol use:  No         ?  Drug use:  No        No Known Allergies        Family History         Problem  Relation  Age of Onset          ?  Cancer  Paternal Grandmother            ?  Cancer  Paternal Grandfather          PHYSICAL EXAMINATION:    Visit Vitals      Ht  5\' 10"  (1.778 m)     Wt  188 lb (85.3 kg)        BMI  26.98 kg/m??            GEN: NAD, alert and oriented   PULM: nl resp effort, no distress   GU: well healed laparoscopic incisions, no palpable masses.    PSYCH: Nl mood and affect         REVIEW OF LABS AND IMAGING:       Results for orders placed or performed  during the hospital encounter of 03/29/19     C. TRACHOMATIS ABS, IGG/IGA/IGM         Result  Value  Ref Range            C. trachomatis Ab (IgG)  <1:64  <1:64         C. trachomatis Ab (IgA)  <1:16  <  1:16         C. trachomatis Ab (IgM)  <1:10  <1:10              Interpretation  Antibody Not Detected           CT Abd Pelv w Cont 07/30/18  No acute abnormalities. No enlarged parametrial vessels or gonadal veins to suggest pelvic congestion syndrome.   Pelviectasis and minimal caliectasis on the left suggests mild UPJ obstruction. Uncertain etiology. No definite crossing vessel although a CTA would be a more sensitive test to evaluate for arterial extrinsic compression at UPJ. While there does not appear  to be significant decrease in function on the left based on enhancement and size is normal, nuclear medicine renogram would be a more sensitive test to evaluate function of the kidneys.  Physiologic quantity free fluid in the pelvis.      Renal Scan 04/28/2018   IMPRESSION:   1. Delayed transit and excretion of the left kidney secondary to UPJ obstruction   2. Normal function of the right kidney.         Imaging Report Reviewed?  yes   Type: CT A/P 07/2018   Images Reviewed?     yes     Type: CT A/P 07/2018   ??   Other Lab Data Reviewed?   yes - CBC, BMP 03/15/19      Kim Reason, MD   [facility] [place]      CC: Kim Spice, MD       Patient's BMI is out of the normal parameters.  Information about BMI was given and patient was advised to follow-up with their PCP for further management.      Medical documentation is provided with the assistance of Kim [name], medical scribe for Kim Reason, MD on  04/20/2019

## 2019-04-20 NOTE — Progress Notes (Signed)
Kim [name]  1995-06-24    ASSESSMENT:     ICD-10-CM ICD-9-CM    1. Pelvic pain  R10.2 IMO0002 CT UROGRAM W WO CONT      REFERRAL TO PHYSICAL THERAPY ([facility])   2. Left flank pain  R10.9 789.09 CT UROGRAM W WO CONT   3. UPJ (ureteropelvic junction) obstruction  N13.5 593.4 CT UROGRAM W WO CONT        -Pelvic pain, chronic x1 yr.  Very bothersome.  Fruitless GYN eval.  No obvious cause for pain on CT 07/2018.  I told her repeat scan probably not going to reveal anything but I think it's worth double checking since symptoms are bothersome, persistent, worse.  Will refer to pelvic floor PT in event that CT doesn't show cause for her symptoms.     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  Renal Scan 04/28/2018: Split function 50/50. T 1/2 3 minutes on the left.   CT A/P 07/2018: Pelviectasis and minimal caliectasis on the left suggests mild UPJ obstruction    PLAN:    CT Urogram ordered to further evaluate pain.   Refer to pelvic floor physical therapy for pelvic pain.   Follow up in 4 weeks to review CT.       Chief Complaint   Patient presents with   ??? UPJ Obstruction   ??? Abdominal Pain     HISTORY OF PRESENT ILLNESS:  Kim [name] is a 24 y.o. Caucasian female who presents in follow up for pelvic pain and left flank pain.   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 Renal Scan 04/28/2018 demonstrated a good post-Lasix T1/2 time of 3 mins in the left kidney.    She reports worsening lower abdominal/pelvic pain and left flank pain for the last 18 months. Now having more frequent episodes of pain and effecting her daily activities. severity of pain varies.  She has had a negative evaluation by GYN. She also reports suprapubic/bladder pain after she finishes voiding. Denies dysuria. No gross hematuria. No f/c/n/v. No urinary frequency, urgency or incontinence.       Review of Systems  Constitutional: Fever: No  Skin: Rash: No  HEENT: Hearing difficulty: No  Eyes: Blurred vision: No   Cardiovascular: Chest pain: No  Respiratory: Shortness of breath: No  Gastrointestinal: Nausea/vomiting: No  Musculoskeletal: Back pain: No  Neurological: Weakness: No  Psychological: Memory loss: No  Comments/additional findings:     Past Medical History:   Diagnosis Date   ??? Bronchitis, chronic ([place])    ??? Chronic kidney disease     congenital left hydronephrosis   ??? Hydronephrosis     congenital left side   ??? Irregular menses    ??? Pelvic pain    ??? Reactive airway disease      Past Surgical History:   Procedure Laterality Date   ??? HX ORTHOPAEDIC Bilateral     knee surgery   ??? HX OTHER SURGICAL      "clean out" bilaterally   ??? HX PELVIC LAPAROSCOPY     ??? HX TONSIL AND ADENOIDECTOMY       Social History     Tobacco Use   ??? Smoking status: Never Smoker   ??? Smokeless tobacco: Never Used   Substance Use Topics   ??? Alcohol use: No   ??? Drug use: No     No Known Allergies    Family History   Problem Relation Age of Onset   ???  Cancer Paternal Grandmother    ??? Cancer Paternal Grandfather      PHYSICAL EXAMINATION:   Visit Vitals  Ht 5\' 10"  (1.778 m)   Wt 188 lb (85.3 kg)   BMI 26.98 kg/m??        GEN: NAD, alert and oriented  PULM: nl resp effort, no distress  GU: well healed laparoscopic incisions, no palpable masses.   PSYCH: Nl mood and affect      REVIEW OF LABS AND IMAGING:    Results for orders placed or performed during the hospital encounter of 03/29/19   C. TRACHOMATIS ABS, IGG/IGA/IGM   Result Value Ref Range    C. trachomatis Ab (IgG) <1:64 <1:64      C. trachomatis Ab (IgA) <1:16 <1:16      C. trachomatis Ab (IgM) <1:10 <1:10      Interpretation Antibody Not Detected       CT Abd Pelv w Cont 07/30/18  No acute abnormalities. No enlarged parametrial vessels or gonadal veins to suggest pelvic congestion syndrome.   Pelviectasis and minimal caliectasis on the left suggests mild UPJ obstruction. Uncertain etiology. No definite crossing vessel although a CTA would be a more sensitive test to evaluate for arterial extrinsic compression at UPJ. While there does not appear to be significant decrease in function on the left based on enhancement and size is normal, nuclear medicine renogram would be a more sensitive test to evaluate function of the kidneys.  Physiologic quantity free fluid in the pelvis.    Renal Scan 04/28/2018  IMPRESSION:  1. Delayed transit and excretion of the left kidney secondary to UPJ obstruction  2. Normal function of the right kidney.      Imaging Report Reviewed?  yes   Type: CT A/P 07/2018  Images Reviewed?     yes     Type: CT A/P 07/2018  ??  Other Lab Data Reviewed?   yes - CBC, BMP 03/15/19    03/17/19, MD  [facility]    CC: [place], MD     Patient's BMI is out of the normal parameters.  Information about BMI was given and patient was advised to follow-up with their PCP for further management.    Medical documentation is provided with the assistance of Sabrina A. Delfenthal, medical scribe for Benay Spice, MD on 04/20/2019

## 2019-04-20 NOTE — Progress Notes (Signed)
Faxed imaging to [facility]; [facility] [phone]; CTU; 05/26/19 F/U

## 2019-04-20 NOTE — Progress Notes (Signed)
Kim [name] has order for CT UROGRAM W WO CONT     To be done at [facility]    Needed by: 05/26/2019    Patient has a follow-up appointment: Yes, 05/26/2019    If MRI, does patient have a pacemaker: N/A    Order has been placed in connect care: Yes    Is this a STAT order:  No      Brittany E Robey

## 2019-05-10 ENCOUNTER — Encounter

## 2019-05-10 NOTE — Progress Notes (Signed)
Results reviewed.  No urgent issues.  Forward results to patient.  Follow up as scheduled for further review and discussion.    Swade Shonka B. Destinee Taber, MD  [facility]

## 2019-05-10 NOTE — Progress Notes (Signed)
Results reviewed.  No urgent issues.  Forward results to patient.  Follow up as scheduled for further review and discussion.    Cana Mignano B. Viktoriya Glaspy, MD  [facility]

## 2019-05-25 NOTE — Telephone Encounter (Signed)
Received physical therapy referral from Dr. Judie Petit. Patient stated she would f/u with the Dr. Again before scheduling. Gave ext information to schedule when she was ready.

## 2019-05-26 ENCOUNTER — Ambulatory Visit: Attending: Physician Assistant | Primary: Family Medicine

## 2019-05-26 ENCOUNTER — Ambulatory Visit: Admit: 2019-05-26 | Discharge: 2019-05-26 | Attending: Physician Assistant | Primary: Family Medicine

## 2019-05-26 DIAGNOSIS — R102 Pelvic and perineal pain: Secondary | ICD-10-CM

## 2019-05-26 LAB — AMB POC URINALYSIS DIP STICK AUTO W/O MICRO
Bilirubin (UA POC): NEGATIVE
Bilirubin, Urine, POC: NEGATIVE
Glucose (UA POC): NEGATIVE
Glucose, Urine, POC: NEGATIVE
Ketones (UA POC): NEGATIVE
Ketones, Urine, POC: NEGATIVE
Leukocyte Esterase, Urine, POC: NEGATIVE
Leukocyte esterase (UA POC): NEGATIVE
Nitrite, Urine, POC: NEGATIVE
Nitrites (UA POC): NEGATIVE
Specific Gravity, Urine, POC: 1.025 NA (ref 1.001–1.035)
Specific gravity (UA POC): 1.025 (ref 1.001–1.035)
Urobilinogen (UA POC): 1 (ref 0.2–1)
Urobilinogen, POC: 1 (ref 0.2–1)
pH (UA POC): 7 (ref 4.6–8.0)
pH, Urine, POC: 7 NA (ref 4.6–8.0)

## 2019-05-26 LAB — AMB POC PVR, MEAS,POST-VOID RES,US,NON-IMAGING
PVR POC: 0 cc
PVR: 0 cc

## 2019-05-26 NOTE — Progress Notes (Signed)
Progress Notes by Teodora Medici, PA-C at 05/26/19 1320                Author: Teodora Medici, PA-C  Service: --  Author Type: Physician Assistant       Filed: 05/26/19 1347  Encounter Date: 05/26/2019  Status: Signed          Editor: Charyl Dancer (Physician Assistant)                       [name]   Nov 18, 1995      ASSESSMENT:              ICD-10-CM  ICD-9-CM             1.  Pelvic pain   R10.2  IMO0002  AMB POC URINALYSIS DIP STICK AUTO W/O MICRO                AMB POC PVR, MEAS,POST-VOID RES,US,NON-IMAGING           URINE C&S           URINALYSIS W/MICROSCOPIC           2.  Left flank pain   R10.9  789.09             3.  UPJ (ureteropelvic junction) obstruction   N13.5  593.4              -Pelvic pain, chronic x1 yr.  Very bothersome.  Fruitless GYN eval.  No obvious cause for pain on CT 07/2018 or recent CTU    CTU 05/07/19: Partially malrotated left kidney. Prominent extrarenal pelvis and mild caliectasis in the left kidney. No obstructive abnormality is evident.      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    Renal Scan 04/28/2018: Split function 50/50. T 1/2 3 minutes on the left.     CT A/P 07/2018: Pelviectasis and minimal caliectasis on the left suggests mild UPJ obstruction      PLAN:     -Reviewed CTU findings from 05/07/19.   -Send today's urine for micro and culture* - will inform   -Proceed with PFPT. Patient has contact info to schedule   -Follow up in 3-4 months for reassessment.           Chief Complaint       Patient presents with        ?  UPJ Obstruction        HISTORY OF PRESENT ILLNESS:   Kim [name] is a 24 y.o. Caucasian  female who presents in follow up for pelvic pain and left flank pain.    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 Renal Scan 04/28/2018 demonstrated a good post-Lasix T1/2 time of 3 mins in the left kidney.      She reports worsening lower abdominal/pelvic pain and left flank pain for the last 18 months. Now having more  frequent episodes of pain and effecting her daily activities. severity of pain varies.  She has had a negative evaluation by GYN. Symptoms not  improved with abx for fallopian tube "fullness." She also reports suprapubic/bladder pain after she finishes voiding. Denies dysuria. No gross hematuria. No urinary frequency, urgency or incontinence.          Review of Systems   Constitutional: Fever: No   Skin: Rash: No   HEENT: Hearing difficulty: No   Eyes: Blurred  vision: No   Cardiovascular: Chest pain: No   Respiratory: Shortness of breath: No   Gastrointestinal: Nausea/vomiting: No   Musculoskeletal: Back pain: No   Neurological: Weakness: No   Psychological: Memory loss: No   Comments/additional findings:         Past Medical History:        Diagnosis  Date         ?  Bronchitis, chronic (HCC)       ?  Chronic kidney disease            congenital left hydronephrosis         ?  Hydronephrosis            congenital left side         ?  Irregular menses       ?  Pelvic pain           ?  Reactive airway disease            Past Surgical History:         Procedure  Laterality  Date          ?  HX ORTHOPAEDIC  Bilateral            knee surgery          ?  HX OTHER SURGICAL              "clean out" bilaterally          ?  HX PELVIC LAPAROSCOPY              ?  HX TONSIL AND ADENOIDECTOMY              Social History          Tobacco Use         ?  Smoking status:  Never Smoker     ?  Smokeless tobacco:  Never Used       Substance Use Topics         ?  Alcohol use:  No         ?  Drug use:  No        No Known Allergies        Family History         Problem  Relation  Age of Onset          ?  Cancer  Paternal Grandmother            ?  Cancer  Paternal Grandfather          PHYSICAL EXAMINATION:    Visit Vitals      Ht  5\' 10"  (1.778 m)     Wt  180 lb (81.6 kg)        BMI  25.83 kg/m??            GEN: NAD, alert and oriented   PULM: nl resp effort, no distress   PSYCH: Nl mood and affect         REVIEW OF LABS AND IMAGING:        Results for orders placed or performed in visit on 05/26/19     AMB POC URINALYSIS DIP STICK AUTO W/O MICRO         Result  Value  Ref Range            Color (UA POC)  Yellow         Clarity (UA POC)  Clear  Glucose (UA POC)  Negative  Negative       Bilirubin (UA POC)  Negative  Negative       Ketones (UA POC)  Negative  Negative       Specific gravity (UA POC)  1.025  1.001 - 1.035       Blood (UA POC)  Trace  Negative       pH (UA POC)  7.0  4.6 - 8.0       Protein (UA POC)  1+  Negative       Urobilinogen (UA POC)  1 mg/dL  0.2 - 1       Nitrites (UA POC)  Negative  Negative       Leukocyte esterase (UA POC)  Negative  Negative       AMB POC PVR, MEAS,POST-VOID RES,US,NON-IMAGING         Result  Value  Ref Range            PVR  0  cc        CTU 05/07/19   Precontrast images reveal no urinary, biliary, or vascular calcifications.  CT ABDOMEN:  Lung bases: Clear.  Liver: ??Unremarkable  Spleen: Unremarkable   Left Kidney: The kidney is partially malrotated with the collecting system anterior rather than anteromedial. There is a prominent extrarenal pelvis and mild caliectasis. No ureteral dilation is evident. No filling defects are evident. No renal pelvis  or ureteral stricture is apparent.. No UPJ obstruction is present  Right Kidney: Negative.  Pancreas: Negative.  Adrenal glands: Normal.  Stomach, Small Bowel and Colon: Unremarkable.  No lymphadenopathy evident.  The abdominal  aorta and cava are unremarkable  Peritoneal aces: There is no free fluid or free air.  !!!!!er:The bladder is unremarkable  Uterus and adnexal structures are only partially cystlike hypodensity in the region of the cervix, probably nabothian.  Otherwise negative      IMPRESSION   Partially malrotated left kidney.   Prominent extrarenal pelvis and mild caliectasis in the left kidney. Unremarkable urinary tract otherwise.   No obstructive abnormality is evident.       CT Abd Pelv w Cont 07/30/18  No acute abnormalities. No enlarged  parametrial vessels or gonadal veins to suggest pelvic congestion syndrome.   Pelviectasis and minimal caliectasis on the left suggests mild UPJ obstruction. Uncertain etiology. No definite crossing vessel although a CTA would be a more sensitive test to evaluate for arterial extrinsic compression at UPJ. While there does not appear  to be significant decrease in function on the left based on enhancement and size is normal, nuclear medicine renogram would be a more sensitive test to evaluate function of the kidneys.  Physiologic quantity free fluid in the pelvis.      Renal Scan 04/28/2018   IMPRESSION:   1. Delayed transit and excretion of the left kidney secondary to UPJ obstruction   2. Normal function of the right kidney.         Imaging Report Reviewed?  yes   Type: CTU 05/07/19   Images Reviewed?     no     Type: N/A   ??   Other Lab Data Reviewed?   no      Vickki Muff PA-C   [facility]       CC: Kris Hartmann Oran Rein, MD

## 2019-05-26 NOTE — Progress Notes (Signed)
Please let patient know that urine did not show any microscopic blood and that urine culture had some bacteria but not overly concerning for a UTI. However, I recommend macrobid 100 mg BID x 5 days given her symptoms to see if this changes pelvic discomfort at all

## 2019-05-26 NOTE — Progress Notes (Signed)
Please let patient know that urine did not show any microscopic blood and that urine culture had some bacteria but not overly concerning for a UTI. However, I recommend macrobid 100 mg BID x 5 days given her symptoms to see if this changes pelvic discomfort at all

## 2019-05-26 NOTE — Progress Notes (Signed)
Kim [name]  July 15, 1995    ASSESSMENT:     ICD-10-CM ICD-9-CM    1. Pelvic pain  R10.2 IMO0002 AMB POC URINALYSIS DIP STICK AUTO W/O MICRO      AMB POC PVR, MEAS,POST-VOID RES,US,NON-IMAGING      URINE C&S      URINALYSIS W/MICROSCOPIC   2. Left flank pain  R10.9 789.09    3. UPJ (ureteropelvic junction) obstruction  N13.5 593.4         -Pelvic pain, chronic x1 yr.  Very bothersome.  Fruitless GYN eval.  No obvious cause for pain on CT 07/2018 or recent CTU   CTU 05/07/19: Partially malrotated left kidney. Prominent extrarenal pelvis and mild caliectasis in the left kidney. No obstructive abnormality is evident.     PVR 0 cc today    -S/p left pyeloplasty 02/2015 for L UPJO.   Renal Scan 04/28/2018: Split function 50/50. T 1/2 3 minutes on the left.    CT A/P 07/2018: Pelviectasis and minimal caliectasis on the left suggests mild UPJ obstruction    PLAN:    -Reviewed CTU findings from 05/07/19.  -Send today's urine for micro and culture* - will inform  -Proceed with PFPT. Patient has contact info to schedule  -Follow up in 3-4 months for reassessment.      Chief Complaint   Patient presents with   ??? UPJ Obstruction     HISTORY OF PRESENT ILLNESS:  Kim [name] is a 24 y.o. Caucasian female who presents in follow up for pelvic pain and left flank pain.   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 Renal Scan 04/28/2018 demonstrated a good post-Lasix T1/2 time of 3 mins in the left kidney.    She reports worsening lower abdominal/pelvic pain and left flank pain for the last 18 months. Now having more frequent episodes of pain and effecting her daily activities. severity of pain varies.  She has had a negative evaluation by GYN. Symptoms not improved with abx for fallopian tube "fullness." She also reports suprapubic/bladder pain after she finishes voiding. Denies dysuria. No gross hematuria. No urinary frequency, urgency or incontinence.       Review of Systems  Constitutional: Fever: No  Skin: Rash: No   HEENT: Hearing difficulty: No  Eyes: Blurred vision: No  Cardiovascular: Chest pain: No  Respiratory: Shortness of breath: No  Gastrointestinal: Nausea/vomiting: No  Musculoskeletal: Back pain: No  Neurological: Weakness: No  Psychological: Memory loss: No  Comments/additional findings:     Past Medical History:   Diagnosis Date   ??? Bronchitis, chronic (HCC)    ??? Chronic kidney disease     congenital left hydronephrosis   ??? Hydronephrosis     congenital left side   ??? Irregular menses    ??? Pelvic pain    ??? Reactive airway disease      Past Surgical History:   Procedure Laterality Date   ??? HX ORTHOPAEDIC Bilateral     knee surgery   ??? HX OTHER SURGICAL      "clean out" bilaterally   ??? HX PELVIC LAPAROSCOPY     ??? HX TONSIL AND ADENOIDECTOMY       Social History     Tobacco Use   ??? Smoking status: Never Smoker   ??? Smokeless tobacco: Never Used   Substance Use Topics   ??? Alcohol use: No   ??? Drug use: No     No Known Allergies    Family History  Problem Relation Age of Onset   ??? Cancer Paternal Grandmother    ??? Cancer Paternal Grandfather      PHYSICAL EXAMINATION:   Visit Vitals  Ht 5\' 10"  (1.778 m)   Wt 180 lb (81.6 kg)   BMI 25.83 kg/m??        GEN: NAD, alert and oriented  PULM: nl resp effort, no distress  PSYCH: Nl mood and affect      REVIEW OF LABS AND IMAGING:    Results for orders placed or performed in visit on 05/26/19   AMB POC URINALYSIS DIP STICK AUTO W/O MICRO   Result Value Ref Range    Color (UA POC) Yellow     Clarity (UA POC) Clear     Glucose (UA POC) Negative Negative    Bilirubin (UA POC) Negative Negative    Ketones (UA POC) Negative Negative    Specific gravity (UA POC) 1.025 1.001 - 1.035    Blood (UA POC) Trace Negative    pH (UA POC) 7.0 4.6 - 8.0    Protein (UA POC) 1+ Negative    Urobilinogen (UA POC) 1 mg/dL 0.2 - 1    Nitrites (UA POC) Negative Negative    Leukocyte esterase (UA POC) Negative Negative   AMB POC PVR, MEAS,POST-VOID RES,US,NON-IMAGING   Result Value Ref Range    PVR 0 cc      CTU 05/07/19  Precontrast images reveal no urinary, biliary, or vascular calcifications.  CT ABDOMEN:  Lung bases: Clear.  Liver: ??Unremarkable  Spleen: Unremarkable  Left Kidney: The kidney is partially malrotated with the collecting system anterior rather than anteromedial. There is a prominent extrarenal pelvis and mild caliectasis. No ureteral dilation is evident. No filling defects are evident. No renal pelvis or ureteral stricture is apparent.. No UPJ obstruction is present  Right Kidney: Negative.  Pancreas: Negative.  Adrenal glands: Normal.  Stomach, Small Bowel and Colon: Unremarkable.  No lymphadenopathy evident.  The abdominal aorta and cava are unremarkable  Peritoneal aces: There is no free fluid or free air.  !!!!!er:The bladder is unremarkable  Uterus and adnexal structures are only partially cystlike hypodensity in the region of the cervix, probably nabothian. Otherwise negative    IMPRESSION   Partially malrotated left kidney.   Prominent extrarenal pelvis and mild caliectasis in the left kidney. Unremarkable urinary tract otherwise.   No obstructive abnormality is evident.     CT Abd Pelv w Cont 07/30/18  No acute abnormalities. No enlarged parametrial vessels or gonadal veins to suggest pelvic congestion syndrome.  Pelviectasis and minimal caliectasis on the left suggests mild UPJ obstruction. Uncertain etiology. No definite crossing vessel although a CTA would be a more sensitive test to evaluate for arterial extrinsic compression at UPJ. While there does not appear to be significant decrease in function on the left based on enhancement and size is normal, nuclear medicine renogram would be a more sensitive test to evaluate function of the kidneys.  Physiologic quantity free fluid in the pelvis.    Renal Scan 04/28/2018  IMPRESSION:  1. Delayed transit and excretion of the left kidney secondary to UPJ obstruction  2. Normal function of the right kidney.      Imaging Report Reviewed?  yes   Type:  CTU 05/07/19  Images Reviewed?     no     Type: N/A  ??  Other Lab Data Reviewed?   no    Teodora Medici PA-C  [facility]  CC: Dulce Sellar, MD

## 2019-05-27 LAB — URINALYSIS W/MICROSCOPIC
Bilirubin: NEGATIVE
Glucose: NEGATIVE
Ketone: NEGATIVE
Leukocyte Esterase: NEGATIVE
Nitrites: NEGATIVE
Specific Gravity: 1.024 (ref 1.005–1.030)
Urobilinogen: 0.2 mg/dL (ref 0.2–1.0)
pH (UA): 7 (ref 5.0–7.5)

## 2019-05-27 LAB — MICROSCOPIC EXAMINATION
Casts UA: NONE SEEN /lpf
Casts: NONE SEEN /lpf

## 2019-05-27 LAB — URINALYSIS WITH MICROSCOPIC
Bilirubin, Urine: NEGATIVE
Glucose, UA: NEGATIVE
Ketones, Urine: NEGATIVE
Leukocyte Esterase, Urine: NEGATIVE
Nitrite, Urine: NEGATIVE
Specific Gravity, UA: 1.024 NA (ref 1.005–1.030)
Urobilinogen, Urine: 0.2 mg/dL (ref 0.2–1.0)
pH, UA: 7 NA (ref 5.0–7.5)

## 2019-05-28 LAB — URINE C&S

## 2019-05-28 MED ORDER — NITROFURANTOIN (25% MACROCRYSTAL FORM) 100 MG CAP
100 mg | ORAL_CAPSULE | Freq: Two times a day (BID) | ORAL | 0 refills | Status: AC
Start: 2019-05-28 — End: 2019-06-02

## 2019-05-28 NOTE — Telephone Encounter (Signed)
-----   Message from [place], [place] sent at 05/28/2019 10:18 AM EST -----  Please let patient know that urine did not show any microscopic blood and that urine culture had some bacteria but not overly concerning for a UTI. However, I recommend macrobid 100 mg BID x 5 days given her symptoms to see if this changes pelvic discomfort at all

## 2019-05-28 NOTE — Telephone Encounter (Signed)
Pt notified of the urine culture result and instructed to pick up her Macrobid at [facility].Kim [name]

## 2019-06-08 NOTE — Telephone Encounter (Signed)
NP, [employer]  Rescheduled pt per provider's request.  Had to reschedule to 2 of 40 min spots

## 2019-07-15 ENCOUNTER — Encounter: Attending: Rehabilitative and Restorative Service Providers" | Primary: Family Medicine

## 2019-07-16 ENCOUNTER — Institutional Professional Consult (permissible substitution)
Admit: 2019-07-16 | Discharge: 2019-07-16 | Attending: Rehabilitative and Restorative Service Providers" | Primary: Family Medicine

## 2019-07-16 DIAGNOSIS — R1030 Lower abdominal pain, unspecified: Secondary | ICD-10-CM

## 2019-07-16 NOTE — Progress Notes (Signed)
 Physical Therapy Evaluation    DOS 07/16/19      Kim [name], 24 y.o. , female presents today for evaluation.      Pt referred by Dr. Gwendlyn with a diagnosis of pelvic pain.  Pt's concerns are for the same.  Pt reports low abdominal pain that will radiate around to her low back as it intensifies.  She cannot identify any cause of her pain, but thought it had something to do with beginning new birth control.  The pt has stopped that birth control and now has a mirena IUD.      From referring provider last visit:  HISTORY OF PRESENT ILLNESS:  Kim [name] is a 24 y.o. Caucasian female who presents in follow up for pelvic pain and left flank pain.   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 Renal Scan 04/28/2018 demonstrated a good post-Lasix T1/2 time of 3 mins in the left kidney.     She reports worsening lower abdominal/pelvic pain and left flank pain for the last 18 months. Now having more frequent episodes of pain and effecting her daily activities. severity of pain varies.  She has had a negative evaluation by GYN. Symptoms not improved with abx for fallopian tube fullness. She also reports suprapubic/bladder pain after she finishes voiding. Denies dysuria. No gross hematuria. No urinary frequency, urgency or incontinence.          Past Medical and Surgical History of relevance:   Past Surgical History:   Procedure Laterality Date   . HX ORTHOPAEDIC Bilateral     knee surgery   . HX OTHER SURGICAL      clean out bilaterally   . HX PELVIC LAPAROSCOPY     . HX TONSIL AND ADENOIDECTOMY       Past Medical History:   Diagnosis Date   . Bronchitis, chronic (HCC)    . Chronic kidney disease     congenital left hydronephrosis   . Hydronephrosis     congenital left side   . Irregular menses    . Pelvic pain    . Reactive airway disease          Pregnancy and Delivery: nulliparous    Social History  sedentary    Precautions  none     Tests    Negative workup from urology and gynecologist     Pain  complaint:  suprapubic pain/discomfort    Pain scale:  Verbal Analog scale: average daily pain 5, best day 1, worst pain 8    Pain description:  Quality:  sharp, stabbing, throbbing and pressure  Behavior:  constant  Worsens with: patient cannot identify any precipitating factors  Improves with: rest, moist heat and deep breathing    Current urinary complaint  leaks with activity (stress induced)  sensation of incomplete bladder emptying intermittently    Urinary frequency:  4-5x/ day  0-1x/ night    Bowel function:  Regular BM, 5-7 per week    Pad use:  none, does not require    Functional limitations:  cannot take travel, take car or bus trips due to pain  cannot sit through movies/theater  cannot lift/carry objects without pain  cannot work/exercise without pain    Physical Exam    Pelvic girdle alignment:  level pelvis, no SI joint tenderness, no dysfunction or obliquity    External Palpation:  Trigger points/hypertonicity/painful palpation:   bilateral hip adductors  bilateral piriformis  bilateral gluteals  bilateral hamstrings  Postural Assessment:  sitting posture with posterior pelvic tilt, weight distribution through sacrum  sitting with rounded shoulders, hyperextension of cervical spine  breathing pattern: shallow and chest  gait: normal    Range of Motion and Flexibility:  Patient with significant low back ROM restriction and accommodates with postural and gait changes  Patient with significant ankle ROM limitations, poor ankle dorsiflexion       Strength:  Pt with normal 5/5 strength in trunk / LE's    WFL for general mobility - not formally evaluated at this visit - will continue to assess as needed in future visits    Special Tests:  ASLR -  positive for decreased control of transverse abdominal     Sensory Tests:  WNL    Pelvic floor manual exam:  Pt educated on pelvic floor dysfunction, physical therapy evaluation and treatment (including internal vaginal/rectal exam). Consent received verbally  to proceed with examination and treatment.       Not tested this visit   Pt declined      EMG Evaluation of pelvic floor musculature    Not performed at this visit        Treatment and Education today:  Initiated pt education including anatomy and physiology of pelvic floor and it's relevance to bladder symptoms, pain.  Initiated pt education including lifestyle modifications pertinent to management of her condition.   Initiated treatment techniques, including neuromuscular reeducation utilizing manual/verbal cues or emg and modalities for pain management  Initiated HEP, to assist pt in taking an active role in rehabilitation and progress towards goals.     Pt given home program education mz:ezocpr muscle downtraining and tone checks daily, progressive therapeutic stretching, use of heat and cold for pain management, activity modification for pain management and progressive relaxation techniques including diaphragmatic breathing    NMR 10 min  Ther Ex 15 min  Self Care 15 min    Patient provided education and received handouts on the following:    []  Kegel HEP  []  Relaxed Voiding  [x]  Pelvic floor muscle release  [x]  Diaphragmatic Breathing  []  Bladder Urge Inhibition  []  Knack  []  Voiding Trial  []  Bladder Health  []  Healthy bowel Habits  []  How to have an easier BM (technique)  []  Fiber  [x]  rockbacks/glutes check    Assessment    Pt presents with the following behavioral/functional findings:   Urinary incontinence  Poor understanding of the body and exercise physiology as it applies to his/her condition   No home exercise program  Poor knowledge of proper posture and body mechanics as it applies to his/her condition  Basic ADL's limited due to pain  Advanced ADL's limited due to pain  Social, Occupational limitations due to pain  Fitness exercise tolerance limited due to pain    Pt presents with the following findings for pelvic orthopedic and skeletal findings:  Mild pelvic obliquity that we will continue to  assess in terms of effect on dysfunction  Abnormal muscle tension and trigger points contributing to pain and dysfunction  Poor trunk and pelvic stability contributing to pain and dysfunction    Pt presents with the following findings for pelvic floor motor function:  Not tested this visit due to orthopedic structural dysfunction      A current functional outcome assessment was performed.  The exam for functional outcome deficiencies was positive (abnormal exam).  Plan of care  was done.      Pt will benefit from skilled PT services  to address the aforementioned symptoms. Good prognosis for goal achievement with regular therapy attendance and diligent home program completion.    Physical Therapy Short Term Goals to be achieved in 1-2 visits:       1. Pt will demonstrate verbal understanding of good dietary habits including adequate fluid intake, dietary considerations for good bladder and bowel habits   2. Pt will be independent with HEP for pain control, relaxation      Physical therapy Long Term goals to be achieved in 12  Weeks (10/08/2019):    1. Pt to report decreased urinary incontinence by 80-100% as defined by decreased use of pads and clothing changes.  2. Patient education - patient will demonstrate verbal understanding of normal function of pelvic floor musculature, bowel/bladder habits and appropriate dietary and behavioral modifications.  3. Patient will be independent in home program for relaxation focused strategies, appropriate stretches and exercises, and modalities such as heat, ice and TENS/IF; all for purpose of good self management of pain .  4. Patient to demonstrate low emg baseline during urinary voiding trial, and low PVR to correlate with proper relaxed voiding and resolution of painful straining during evacuation of bladder.  5. Patient reports decrease of pain by 80- 100% through resolution of pelvic malalignment and instabilities, normalization of flexibility, resolution of both internal  and external muscle spasm and trigger points, and improved motor control and strength of pelvic musculature.   6. Patient will return to functional ADL's with decreased or no pain, including exercise, sexual activity and work functions.      Physical Therapy Plan of Treatment (may include)    Therapeutic exercise  Neuromotor re-education  Manual therapy  Progressive PME's   Modalities as needed for pain control (US , Estim, Heat), neuromuscular electrical stimulation  Purchase of PF Stimulator or TENS/IF unit for home use  Patient education and home program training  EMG assessment  Biofeedback training  Bladder scan   Dry Needling  PVR        Frequency:  1 x/week for up to 12 weeks, adjust frequency of visits prn    Next visit:    emg  issue home TENS/IFES  reassess trunk/hip/LE ROM/flexibility/strength       Chronic Prostatitis Symptom Index for the Past Week 07/16/2019   Pain or discomfort in the labia? 0   Pain or discomfort in the clitoris (not related to urination)? 0   Pain or discomfort below your waist in your pubic area? 1   Pain or discomfort below your waist in your rectal area? 0   Pain or burning during urination? 1   Pain or discomfort during or after sexual climax? 0   How often have you had pain or discomfort in any of the above areas? 2   AVERAGE pain or discomfort level on the days you had it? 5   Pain or Discomfort Score 9   Have had a sensation of not emptying your bladder completely? 2   Have had to urinate again less that 2 hrs after urinating? 2   Urinary Symptom Score 4   Symptom Scale Score 13   Have your symptoms kept you from doing things you usually do? 1   How much did you think about your symptoms? 3   How would you feel about these symptoms for the rest of your life? 4   Quality of Life Score 8   Some recent data might be hidden  UDI 6 and IIQ 7  07/16/2019   UDI 1 Freq urination 1   UDI 2 Leak - urgency 1   UDI 3 Leak - activity 1   UDI 4 Small leak 1   UDI 5 Difficulty empty 1    UDI 6 Pain abd or pelvis 3   UDI 6 Score 44   IIQ 1 Affected chores 0   IIQ 2 Affected recreation 3   IIQ 3 Affected entertainment 0   IIQ 4 Affected travel 1   IIQ 5 Affected social 1   IIQ 6 Affected emotional 1   IIQ 7 Frustration 3   IIQ 7 Score 43     CRADI-8 SCORE 07/16/2019   Do you feel you need to strain too hard to have a bowel movement? 0   Do you feel you have not completely emptied your bowels at the end of a bowel movement? 2   Do you usually lose stool beyond your control if your stool is well formed? 0   Do you usually lose stool beyond your control if your stool is loose? 0   Do you usually lose gas from your rectum beyond your control? 0   Do you usually have pain when you pass your stool? 0   Do you experience a strong sense of urgency and have to rush to the bathroom to have a bowel movement? 1   Does a part of your bowel ever pass through the rectum and bulge outside during or after a bowel movement? 0   CRADI-8  Score 3                      Time In: 1233  Time Out: 1333   Total time: 60 min  Total timed codes treatment: 40 min  Total 1:1 time: 63         Dr. Westly is available in clinic as incident to provider.          Leita Dee PT, DPT    Physical Therapist  [facility] 

## 2019-07-16 NOTE — Progress Notes (Signed)
Physical Therapy Evaluation    DOS 07/16/19      Kim [name], 24 y.o. , female presents today for evaluation.      Pt referred by Dr. Judie Petit with a diagnosis of pelvic pain.  Pt's concerns are for the same.  Pt reports low abdominal pain that will radiate around to her low back as it intensifies.  She cannot identify any cause of her pain, but thought it had something to do with beginning new birth control.  The pt has stopped that birth control and now has a mirena IUD.      From referring provider last visit:  HISTORY OF PRESENT ILLNESS:  Kim [name] is a 24 y.o. Caucasian female who presents in follow up for pelvic pain and left flank pain.   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 Renal Scan 04/28/2018 demonstrated a good post-Lasix T1/2 time of 3 mins in the left kidney.     She reports worsening lower abdominal/pelvic pain and left flank pain for the last 18 months. Now having more frequent episodes of pain and effecting her daily activities. severity of pain varies.  She has had a negative evaluation by GYN. Symptoms not improved with abx for fallopian tube "fullness." She also reports suprapubic/bladder pain after she finishes voiding. Denies dysuria. No gross hematuria. No urinary frequency, urgency or incontinence.          Past Medical and Surgical History of relevance:   Past Surgical History:   Procedure Laterality Date   ??? HX ORTHOPAEDIC Bilateral     knee surgery   ??? HX OTHER SURGICAL      "clean out" bilaterally   ??? HX PELVIC LAPAROSCOPY     ??? HX TONSIL AND ADENOIDECTOMY       Past Medical History:   Diagnosis Date   ??? Bronchitis, chronic (HCC)    ??? Chronic kidney disease     congenital left hydronephrosis   ??? Hydronephrosis     congenital left side   ??? Irregular menses    ??? Pelvic pain    ??? Reactive airway disease          Pregnancy and Delivery: nulliparous    Social History  sedentary    Precautions  none     Tests    Negative workup from urology and gynecologist     Pain  complaint:  suprapubic pain/discomfort    Pain scale:  Verbal Analog scale: average daily pain 5, best day 1, worst pain 8    Pain description:  Quality:  sharp, stabbing, throbbing and pressure  Behavior:  constant  Worsens with: patient cannot identify any precipitating factors  Improves with: rest, moist heat and deep breathing    Current urinary complaint  leaks with activity (stress induced)  sensation of incomplete bladder emptying intermittently    Urinary frequency:  4-5x/ day  0-1x/ night    Bowel function:  Regular BM, 5-7 per week    Pad use:  none, does not require    Functional limitations:  cannot take travel, take car or bus trips due to pain  cannot sit through movies/theater  cannot lift/carry objects without pain  cannot work/exercise without pain    Physical Exam    Pelvic girdle alignment:  level pelvis, no SI joint tenderness, no dysfunction or obliquity    External Palpation:  Trigger points/hypertonicity/painful palpation:   bilateral hip adductors  bilateral piriformis  bilateral gluteals  bilateral hamstrings  Postural Assessment:  sitting posture with posterior pelvic tilt, weight distribution through sacrum  sitting with rounded shoulders, hyperextension of cervical spine  breathing pattern: shallow and chest  gait: normal    Range of Motion and Flexibility:  Patient with significant low back ROM restriction and accommodates with postural and gait changes  Patient with significant ankle ROM limitations, poor ankle dorsiflexion       Strength:  Pt with normal 5/5 strength in trunk / LE's    WFL for general mobility - not formally evaluated at this visit - will continue to assess as needed in future visits    Special Tests:  ASLR -  positive for decreased control of transverse abdominal     Sensory Tests:  WNL    Pelvic floor manual exam:  Pt educated on pelvic floor dysfunction, physical therapy evaluation and treatment (including internal vaginal/rectal exam). Consent received verbally  to proceed with examination and treatment.       Not tested this visit   Pt declined      EMG Evaluation of pelvic floor musculature    Not performed at this visit        Treatment and Education today:  Initiated pt education including anatomy and physiology of pelvic floor and it's relevance to bladder symptoms, pain.  Initiated pt education including lifestyle modifications pertinent to management of her condition.   Initiated treatment techniques, including neuromuscular reeducation utilizing manual/verbal cues or emg and modalities for pain management  Initiated HEP, to assist pt in taking an active role in rehabilitation and progress towards goals.     Pt given home program education re:pelvic muscle downtraining and "tone checks" daily, progressive therapeutic stretching, use of heat and cold for pain management, activity modification for pain management and progressive relaxation techniques including diaphragmatic breathing    NMR 10 min  Ther Ex 15 min  Self Care 15 min    Patient provided education and received handouts on the following:    [] Kegel HEP  [] Relaxed Voiding  [x] Pelvic floor muscle release  [x] Diaphragmatic Breathing  [] Bladder Urge Inhibition  [] Knack  [] Voiding Trial  [] Bladder Health  [] Healthy bowel Habits  [] How to have an easier BM (technique)  [] Fiber  [x] rockbacks/glutes check    Assessment    Pt presents with the following behavioral/functional findings:   Urinary incontinence  Poor understanding of the body and exercise physiology as it applies to his/her condition   No home exercise program  Poor knowledge of proper posture and body mechanics as it applies to his/her condition  Basic ADL's limited due to pain  Advanced ADL's limited due to pain  Social, Occupational limitations due to pain  Fitness exercise tolerance limited due to pain    Pt presents with the following findings for pelvic orthopedic and skeletal findings:  Mild pelvic obliquity that we will continue to  assess in terms of effect on dysfunction  Abnormal muscle tension and trigger points contributing to pain and dysfunction  Poor trunk and pelvic stability contributing to pain and dysfunction    Pt presents with the following findings for pelvic floor motor function:  Not tested this visit due to orthopedic structural dysfunction      A current functional outcome assessment was performed.  The exam for functional outcome deficiencies was positive (abnormal exam).  Plan of care  was done.      Pt will benefit from skilled PT services   to address the aforementioned symptoms. Good prognosis for goal achievement with regular therapy attendance and diligent home program completion.    Physical Therapy Short Term Goals to be achieved in 1-2 visits:       1. Pt will demonstrate verbal understanding of good dietary habits including adequate fluid intake, dietary considerations for good bladder and bowel habits   2. Pt will be independent with HEP for pain control, relaxation      Physical therapy Long Term goals to be achieved in 12  Weeks (10/08/2019):    1. Pt to report decreased urinary incontinence by 80-100% as defined by decreased use of pads and clothing changes.  2. Patient education - patient will demonstrate verbal understanding of normal function of pelvic floor musculature, bowel/bladder habits and appropriate dietary and behavioral modifications.  3. Patient will be independent in home program for relaxation focused strategies, appropriate stretches and exercises, and modalities such as heat, ice and TENS/IF; all for purpose of good self management of pain .  4. Patient to demonstrate low emg baseline during urinary voiding trial, and low PVR to correlate with proper relaxed voiding and resolution of painful straining during evacuation of bladder.  5. Patient reports decrease of pain by 80- 100% through resolution of pelvic malalignment and instabilities, normalization of flexibility, resolution of both internal  and external muscle spasm and trigger points, and improved motor control and strength of pelvic musculature.   6. Patient will return to functional ADL's with decreased or no pain, including exercise, sexual activity and work functions.      Physical Therapy Plan of Treatment (may include)    Therapeutic exercise  Neuromotor re-education  Manual therapy  Progressive PME's   Modalities as needed for pain control ([place], Estim, Heat), neuromuscular electrical stimulation  Purchase of PF Stimulator or TENS/IF unit for home use  Patient education and home program training  EMG assessment  Biofeedback training  Bladder scan   Dry Needling  PVR        Frequency:  1 x/week for up to 12 weeks, adjust frequency of visits prn    Next visit:    emg  issue home TENS/IFES  reassess trunk/hip/LE ROM/flexibility/strength       Chronic Prostatitis Symptom Index for the Past Week 07/16/2019   Pain or discomfort in the labia? 0   Pain or discomfort in the clitoris (not related to urination)? 0   Pain or discomfort below your waist in your pubic area? 1   Pain or discomfort below your waist in your rectal area? 0   Pain or burning during urination? 1   Pain or discomfort during or after sexual climax? 0   How often have you had pain or discomfort in any of the above areas? 2   AVERAGE pain or discomfort level on the days you had it? 5   Pain or Discomfort Score 9   Have had a sensation of not emptying your bladder completely? 2   Have had to urinate again less that 2 hrs after urinating? 2   Urinary Symptom Score 4   Symptom Scale Score 13   Have your symptoms kept you from doing things you usually do? 1   How much did you think about your symptoms? 3   How would you feel about these symptoms for the rest of your life? 4   Quality of Life Score 8   Some recent data might be hidden  UDI 6 and IIQ 7  07/16/2019   UDI 1 Freq urination 1   UDI 2 Leak - urgency 1   UDI 3 Leak - activity 1   UDI 4 Small leak 1   UDI 5 Difficulty empty 1    UDI 6 Pain abd or pelvis 3   UDI 6 Score 44   IIQ 1 Affected chores 0   IIQ 2 Affected recreation 3   IIQ 3 Affected entertainment 0   IIQ 4 Affected travel 1   IIQ 5 Affected social 1   IIQ 6 Affected emotional 1   IIQ 7 Frustration 3   IIQ 7 Score 43     CRADI-8 SCORE 07/16/2019   Do you feel you need to strain too hard to have a bowel movement? 0   Do you feel you have not completely emptied your bowels at the end of a bowel movement? 2   Do you usually lose stool beyond your control if your stool is well formed? 0   Do you usually lose stool beyond your control if your stool is loose? 0   Do you usually lose gas from your rectum beyond your control? 0   Do you usually have pain when you pass your stool? 0   Do you experience a strong sense of urgency and have to rush to the bathroom to have a bowel movement? 1   Does a part of your bowel ever pass through the rectum and bulge outside during or after a bowel movement? 0   CRADI-8  Score 3                      Time In: 1233  Time Out: 1333   Total time: 60 min  Total timed codes treatment: 40 min  Total 1:1 time: 71         Dr. Janalyn Rouse is available in clinic as incident to provider.          Cameron Sprang PT, DPT    Physical Therapist  [facility]

## 2019-07-20 ENCOUNTER — Encounter: Attending: Rehabilitative and Restorative Service Providers" | Primary: Family Medicine

## 2019-07-22 ENCOUNTER — Encounter: Attending: Rehabilitative and Restorative Service Providers" | Primary: Family Medicine

## 2019-07-29 ENCOUNTER — Institutional Professional Consult (permissible substitution)
Admit: 2019-07-29 | Discharge: 2019-07-29 | Attending: Rehabilitative and Restorative Service Providers" | Primary: Family Medicine

## 2019-07-29 DIAGNOSIS — R1030 Lower abdominal pain, unspecified: Secondary | ICD-10-CM

## 2019-07-29 NOTE — Progress Notes (Signed)
Physical Therapy Treatment Note     Patient: Kim [name]    DOS: 07/29/2019 Visit #: 2      Subjective    Pt reports:  no significant changes  compliance with home program  Feels DB helps the most  Pain 5-6/10 at start and end of visit    Objective    External Manual Therapy  10 min    Diaphragm release performed bilaterally with improved mobility/ROM with DB. Pt notes improved feeling of inhale as well.      Pt able to demo good self diaphragm release technique and will add to HEP.      Therapeutic exercise  30 min    Progressive Therapeutic Stretching     Patient was instructed through a series of progressive stretching exercises to improve posture and core strengthening for improved quality of live. If appropriate, patient performed exercises using synchronized diaphragmatic breathing to further reinforce proper breathing mechanics for relaxation and engaging core muscles. Pt was educated using both verbal and tactile feedback to ensure correct positioning.      Indications:  [x]  Myofascial pain and dysfunction       []  Muscled spasms  []  Myalgia/myositis                                []  Muscle cramps  []  Muscle imbalances                            [x]  Increase ROM  [x]  Decrease pain                                    []  Increase strength  []  Other:      Exercise   Date  4/15 Date Date Date Date Date Date Date   Single knee to chest           Double knee to chest           Happy baby  (modified)           Butterfly  Supine seated           Piriformis/ fig 4  Seated supine             Childs pose  (3 way)           Hip flexor  Supine  Tall kneel  Stand Supine 60"  Tall knee 30"  Stand 30"          Trunk rotation  Windmill  Open books            Cat-cow           QL  Supine standing             Prone press up  Hold  Repeat  HOC            Hamstring with strap           Walking hip rotation ER/IR    x1' each direction  Patient's response to today's treatment:  []  Residual Soreness     []  Muscle relaxation       []  Pain Relief  []  Increased ROM          [x]  Other:  No change to pain  [x]  able to complete (I) with cues      Patient education  5 min    Pt given home program education [id number] therapeutic stretching, Self trigger point release and external manual therapy methods and Progressive relaxation techniques including diaphragmatic breathing       Assessment    Pt with no change to pain/symptoms since start of care.  Identified poor diaphragm movement with deep breathing and decrease hip flexion R>L.  Added diaphragm release and mobilization with RTB, as well as hip flexor stretching to HEP. Pt's pain did not change with any intervention today; no worse, no better. Will plan to see for 2 additional visits and reassess/potentially send back to MD if no change.      Plan    Patient will continue with HEP, with above treatment modifications        Time In: 857  Time Out: 942   Total time: 45 minutes  Total timed codes treatment (MCR/BCBS): 45 minutes  Total 1:1 time: 45 minutes      Dr. Cleda Mccreedy is available in clinic as incident to provider.          Cameron Sprang PT, DPT    Physical Therapist  [facility]

## 2019-07-29 NOTE — Progress Notes (Signed)
Physical Therapy Treatment Note     Patient: Kim [name]    DOS: 07/29/2019 Visit #: 2      Subjective    Pt reports:  no significant changes  compliance with home program  Feels DB helps the most  Pain 5-6/10 at start and end of visit    Objective    External Manual Therapy  10 min    Diaphragm release performed bilaterally with improved mobility/ROM with DB. Pt notes improved feeling of inhale as well.      Pt able to demo good self diaphragm release technique and will add to HEP.      Therapeutic exercise  30 min    Progressive Therapeutic Stretching     Patient was instructed through a series of progressive stretching exercises to improve posture and core strengthening for improved quality of live. If appropriate, patient performed exercises using synchronized diaphragmatic breathing to further reinforce proper breathing mechanics for relaxation and engaging core muscles. Pt was educated using both verbal and tactile feedback to ensure correct positioning.      Indications:  [x]  Myofascial pain and dysfunction       []  Muscled spasms  []  Myalgia/myositis                                []  Muscle cramps  []  Muscle imbalances                            [x]  Increase ROM  [x]  Decrease pain                                    []  Increase strength  []  Other:      Exercise   Date  4/15 Date Date Date Date Date Date Date   Single knee to chest           Double knee to chest           Happy baby  (modified)           Butterfly  Supine seated           Piriformis/ fig 4  Seated supine             Childs pose  (3 way)           Hip flexor  Supine  Tall kneel  Stand Supine 60"  Tall knee 30"  Stand 30"          Trunk rotation  Windmill  Open books            Cat-cow           QL  Supine standing             Prone press up  Hold  Repeat  HOC            Hamstring with strap           Walking hip rotation ER/IR    x1' each direction  Patient's response to today's treatment:  [] Residual Soreness     [] Muscle relaxation       [] Pain Relief  [] Increased ROM          [x] Other:  No change to pain  [x] able to complete (I) with cues      Patient education  5 min    Pt given home program education re:Progressive therapeutic stretching, Self trigger point release and external manual therapy methods and Progressive relaxation techniques including diaphragmatic breathing       Assessment    Pt with no change to pain/symptoms since start of care.  Identified poor diaphragm movement with deep breathing and decrease hip flexion R>L.  Added diaphragm release and mobilization with RTB, as well as hip flexor stretching to HEP. Pt's pain did not change with any intervention today; no worse, no better. Will plan to see for 2 additional visits and reassess/potentially send back to MD if no change.      Plan    Patient will continue with HEP, with above treatment modifications        Time In: 857  Time Out: 942   Total time: 45 minutes  Total timed codes treatment (MCR/BCBS): 45 minutes  Total 1:1 time: 45 minutes      Dr. Hughart is available in clinic as incident to provider.          Laura McFarland PT, DPT    Physical Therapist  [facility]

## 2019-08-16 NOTE — Telephone Encounter (Signed)
LM on VM offering f/u avail with Vernona Rieger, requested to call back at [phone].

## 2019-08-26 NOTE — Telephone Encounter (Signed)
L/m requesting a return call to ext [phone] in regard to scheduling more visits with McFarland, L.

## 2019-08-31 ENCOUNTER — Institutional Professional Consult (permissible substitution)
Admit: 2019-08-31 | Discharge: 2019-08-31 | Attending: Rehabilitative and Restorative Service Providers" | Primary: Family Medicine

## 2019-08-31 DIAGNOSIS — R1030 Lower abdominal pain, unspecified: Secondary | ICD-10-CM

## 2019-08-31 NOTE — Progress Notes (Signed)
 Physical Therapy Treatment Note     Patient: Kim [name]    DOS: 08/31/2019 Visit #: 3      Subjective    Pt reports:  no significant changes  Moderate compliance to HEP  Can say with confidence that sitting in the car and on a soft couch will increase her pain, otherwise pain is random and waxes/wanes without trigger    Objective    External Manual Therapy  10 min    trigger point release to the following areas: psoas  response to external manual therapy: fair response, noted diminished spasm but patient still tender/hypersensitive    Therapeutic exercise  40 min    Progressive Therapeutic Stretching/Strengthening     Patient was instructed through a series of progressive stretching exercises to improve posture and core strengthening for improved quality of live. If appropriate, patient performed exercises using synchronized diaphragmatic breathing to further reinforce proper breathing mechanics for relaxation and engaging core muscles. Pt was educated using both verbal and tactile feedback to ensure correct positioning.      Indications:  [x]  Myofascial pain and dysfunction       []  Muscled spasms  []  Myalgia/myositis                                []  Muscle cramps  []  Muscle imbalances                            [x]  Increase ROM  [x]  Decrease pain                                    []  Increase strength  []  Other:      Exercise   Date  4/15 Date  5/18 Date Date Date Date Date Date   Single knee to chest           Double knee to chest           Happy baby  (modified)           Butterfly  Supine seated           Piriformis/ fig 4  Seated supine             Childs pose  (3 way)           Hip flexor  Supine  Tall kneel  Stand Supine 60  Tall knee 30  Stand 30 Cont.    Modified standing to include ipsilateral OH reach         Trunk rotation  Windmill  Open books            Cat-cow           QL  Supine standing             Prone press up  Hold  Repeat  HOC            Hamstring with strap           Walking hip  rotation ER/IR    x1' each direction cont         TA brace with exhale and PF initiation  2x10  Supine &  sitting           REIL  x10 with long exhale at top (repeated cues for glute relaxation)  Patient's response to today's treatment:  []  Residual Soreness     []  Muscle relaxation       []  Pain Relief  []  Increased ROM          [x]  Other:  No change to pain  [x]  able to complete (I) with cues      Patient education  10 min    Pt given home program education mz:[id number] therapeutic stretching, Progressive core strengthening, Activity and lifestyle modification for pain management and Progressive relaxation techniques including diaphragmatic breathing       Assessment    Pt with no progress towards goals and no decrease in pain since last visit.  Difficult to ascertain exactly when pain occurs as pt is not very descriptive in her subjective, however she did admit that sitting int he care and on a soft sofa will exacerbate her pain.  Today we focused efforts of postural alignment in supine and sitting, as well as introducing pelvic bracing.  Pt initially with no TA contraction, however with breath cues and tactile cues, able to elicit moderate TA in supine and sitting.  Pt to add pelvic brace to HEP.  Also began REIL with emphasis on abdominal and gluteal relaxation with diaphragmatic breath at the top and long exhale to increase physiologic end range.     Also discussed with pt nature of pain caused by muscles and that not all of her symptoms and pain reports line up well with pain only caused by musculature.  Pt has follow up appt with PA next week and will seek further assessment.      Plan    Patient will continue with HEP, with above treatment modifications  Continued patient education regarding bladder habits, normal voiding patterns, dietary habits etc.  Progress therapeutic exercises as tolerated  Recommend follow up with physician    Reviewed bladder emptying as pt reports she has had recent increased frequency and straining to empty.        Time In: 1300  Time Out: 1400   Total time: 60 minutes  Total timed codes treatment (MCR/BCBS): 60 minutes  Total 1:1 time: 60 minutes      Dr. Arsenio is available in clinic as incident to provider.          Leita Dee PT, DPT    Physical Therapist  [facility] 

## 2019-08-31 NOTE — Progress Notes (Signed)
Physical Therapy Treatment Note     Patient: Kim [name]    DOS: 08/31/2019 Visit #: 3      Subjective    Pt reports:  no significant changes  Moderate compliance to HEP  Can say with confidence that sitting in the car and on a soft couch will increase her pain, otherwise pain is random and waxes/wanes without trigger    Objective    External Manual Therapy  10 min    trigger point release to the following areas: psoas  response to external manual therapy: fair response, noted diminished spasm but patient still tender/hypersensitive    Therapeutic exercise  40 min    Progressive Therapeutic Stretching/Strengthening     Patient was instructed through a series of progressive stretching exercises to improve posture and core strengthening for improved quality of live. If appropriate, patient performed exercises using synchronized diaphragmatic breathing to further reinforce proper breathing mechanics for relaxation and engaging core muscles. Pt was educated using both verbal and tactile feedback to ensure correct positioning.      Indications:  [x] Myofascial pain and dysfunction       [] Muscled spasms  [] Myalgia/myositis                                [] Muscle cramps  [] Muscle imbalances                            [x] Increase ROM  [x] Decrease pain                                    [] Increase strength  [] Other:      Exercise   Date  4/15 Date  5/18 Date Date Date Date Date Date   Single knee to chest           Double knee to chest           Happy baby  (modified)           Butterfly  Supine seated           Piriformis/ fig 4  Seated supine             Childs pose  (3 way)           Hip flexor  Supine  Tall kneel  Stand Supine 60"  Tall knee 30"  Stand 30" Cont.    Modified standing to include ipsilateral OH reach         Trunk rotation  Windmill  Open books            Cat-cow           QL  Supine standing             Prone press up  Hold  Repeat  HOC            Hamstring with strap           Walking hip  rotation ER/IR    x1' each direction cont         TA brace with exhale and PF initiation  2x10  Supine &  sitting           REIL  x10 with long exhale at top (repeated cues for glute relaxation)                                                                      Patient's response to today's treatment:  []  Residual Soreness     []  Muscle relaxation       []  Pain Relief  []  Increased ROM          [x]  Other:  No change to pain  [x]  able to complete (I) with cues      Patient education  10 min    Pt given home program education therapeutic stretching, Progressive core strengthening, Activity and lifestyle modification for pain management and Progressive relaxation techniques including diaphragmatic breathing       Assessment    Pt with no progress towards goals and no decrease in pain since last visit.  Difficult to ascertain exactly when pain occurs as pt is not very descriptive in her subjective, however she did admit that sitting int he care and on a soft sofa will exacerbate her pain.  Today we focused efforts of postural alignment in supine and sitting, as well as introducing pelvic bracing.  Pt initially with no TA contraction, however with breath cues and tactile cues, able to elicit moderate TA in supine and sitting.  Pt to add pelvic brace to HEP.  Also began REIL with emphasis on abdominal and gluteal relaxation with diaphragmatic breath at the top and long exhale to increase physiologic end range.     Also discussed with pt nature of pain caused by muscles and that not all of her symptoms and pain reports line up well with pain only caused by musculature.  Pt has follow up appt with PA next week and will seek further assessment.      Plan    Patient will continue with HEP, with above treatment modifications  Continued patient education regarding bladder habits, normal voiding patterns, dietary habits etc.  Progress therapeutic exercises as tolerated  Recommend follow up with physician    Reviewed bladder emptying as pt reports she has had recent increased frequency and straining to empty.        Time In: 1300  Time Out: 1400   Total time: 60 minutes  Total timed codes treatment (MCR/BCBS): 60 minutes  Total 1:1 time: 60 minutes      Dr. is available in clinic as incident to provider.          PT, DPT    Physical Therapist  [facility] 

## 2019-09-10 ENCOUNTER — Ambulatory Visit: Attending: Physician Assistant | Primary: Family Medicine

## 2019-09-10 ENCOUNTER — Ambulatory Visit: Admit: 2019-09-10 | Discharge: 2019-09-10 | Attending: Physician Assistant | Primary: Family Medicine

## 2019-09-10 DIAGNOSIS — R3914 Feeling of incomplete bladder emptying: Secondary | ICD-10-CM

## 2019-09-10 LAB — AMB POC URINALYSIS DIP STICK AUTO W/O MICRO
Bilirubin (UA POC): NEGATIVE
Bilirubin, Urine, POC: NEGATIVE
Blood (UA POC): NEGATIVE
Blood (UA POC): NEGATIVE
Glucose (UA POC): NEGATIVE
Glucose, Urine, POC: NEGATIVE
Ketones (UA POC): NEGATIVE
Ketones, Urine, POC: NEGATIVE
Nitrite, Urine, POC: NEGATIVE
Nitrites (UA POC): NEGATIVE
Protein (UA POC): NEGATIVE
Protein, Urine, POC: NEGATIVE
Specific Gravity, Urine, POC: 1.025 NA (ref 1.001–1.035)
Specific gravity (UA POC): 1.025 (ref 1.001–1.035)
Urobilinogen (UA POC): 0.2 (ref 0.2–1)
Urobilinogen, POC: 0.2 (ref 0.2–1)
pH (UA POC): 7 (ref 4.6–8.0)
pH, Urine, POC: 7 NA (ref 4.6–8.0)

## 2019-09-10 LAB — AMB POC PVR, MEAS,POST-VOID RES,US,NON-IMAGING
PVR POC: 0 cc
PVR: 0 cc

## 2019-09-10 NOTE — Progress Notes (Signed)
Progress Notes by Vickki Muff, PA-C at 09/10/19 1340                Author: Vickki Muff, PA-C  Service: --  Author Type: Physician Assistant       Filed: 09/10/19 1414  Encounter Date: 09/10/2019  Status: Signed          Editor: Vickki Muff, PA-C (Physician Assistant)                       Kim [name]   12/01/1995      ASSESSMENT:              ICD-10-CM  ICD-9-CM             1.  Feeling of incomplete bladder emptying   R39.14  788.21  REFERRAL TO [facility]ICS ([facility])     2.  Pelvic pain   R10.2  IMO0002  AMB POC URINALYSIS DIP STICK AUTO W/O MICRO                AMB POC PVR, MEAS,POST-VOID RES,US,NON-IMAGING                REFERRAL TO [facility]ICS ([facility])            -Pelvic pain, chronic x1 yr.  Very bothersome.  Fruitless GYN eval.  No obvious cause for pain on CT 07/2018 or recent CTU    CTU 05/07/19: Partially malrotated left kidney. Prominent extrarenal pelvis and mild caliectasis in the left kidney. No obstructive abnormality is evident.      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    Renal Scan 04/28/2018: Split function 50/50. T 1/2 3 minutes on the left.     CT A/P 07/2018: Pelviectasis and minimal caliectasis on the left suggests mild UPJ obstruction    CTU 05/07/19: No obstructive abnormality is evident.        PLAN:     Cysto with Belenda Cruise [place] NP-C per patient preference   Any additional recommendations for further eval from NP [place] appreciated   Urodynamics study recommended. Referral placed.   If no obvious etiology identified to explain patient's symptoms, consider trial gabapentin   Follow-up 2 months for reassessment           Chief Complaint       Patient presents with        ?  Pelvic Pain        HISTORY OF PRESENT ILLNESS:   Kim [name] is a 24 y.o. Caucasian  female who presents in follow up for pelvic pain and left flank pain.    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 Renal Scan  04/28/2018 demonstrated a good post-Lasix T1/2 time of 3 mins in the left kidney.      She is currently being seen for worsening lower abdominal/pelvic pain and left flank pain for the last 18+ months. Now having more frequent episodes of pain that is affecting her daily activities. severity of pain varies.  She has had a negative evaluation  by GYN. Symptoms not improved with abx for fallopian tube "fullness." She also reports suprapubic/bladder pain after she finishes voiding that builds as her bladder gets fuller. Describes as stabbing. Her biggest issues is the SP pain she has that prevents  her sometimes even from walking. She had to sit down while walking  on the beach the other day due to [facility] discomfort. Denies dysuria. No gross hematuria. No urinary frequency, urgency or incontinence.        No benefit with PFPT at all. She has been performing the exercises as recommended. PT does not feel symptoms are solely MSK in nature.      Review of Systems   Constitutional: Fever: No   Skin: Rash: No   HEENT: Hearing difficulty: No   Eyes: Blurred vision: No   Cardiovascular: Chest pain: No   Respiratory: Shortness of breath: No   Gastrointestinal: Nausea/vomiting: No   Musculoskeletal: Back pain: No   Neurological: Weakness: No   Psychological: Memory loss: No   Comments/additional findings:         Past Medical History:        Diagnosis  Date         ?  Bronchitis, chronic ([place])       ?  Chronic kidney disease            congenital left hydronephrosis         ?  Hydronephrosis            congenital left side         ?  Irregular menses       ?  Pelvic pain           ?  Reactive airway disease            Past Surgical History:         Procedure  Laterality  Date          ?  HX ORTHOPAEDIC  Bilateral            knee surgery          ?  HX OTHER SURGICAL              "clean out" bilaterally          ?  HX PELVIC LAPAROSCOPY              ?  HX TONSIL AND ADENOIDECTOMY              Social History          Tobacco Use          ?  Smoking status:  Never Smoker     ?  Smokeless tobacco:  Never Used       Substance Use Topics         ?  Alcohol use:  No         ?  Drug use:  No        No Known Allergies        Family History         Problem  Relation  Age of Onset          ?  Cancer  Paternal Grandmother            ?  Cancer  Paternal Grandfather          PHYSICAL EXAMINATION:    Visit Vitals      Ht  5\' 10"  (1.778 m)     Wt  187 lb (84.8 kg)        BMI  26.83 kg/m??         GEN: NAD, alert and oriented   PULM: nl resp effort, no distress   PSYCH: Nl mood and affect      REVIEW OF LABS  AND IMAGING:       Results for orders placed or performed in visit on 09/10/19     AMB POC URINALYSIS DIP STICK AUTO W/O MICRO         Result  Value  Ref Range            Color (UA POC)  Yellow         Clarity (UA POC)  Clear         Glucose (UA POC)  Negative  Negative       Bilirubin (UA POC)  Negative  Negative       Ketones (UA POC)  Negative  Negative       Specific gravity (UA POC)  1.025  1.001 - 1.035       Blood (UA POC)  Negative  Negative       pH (UA POC)  7.0  4.6 - 8.0       Protein (UA POC)  Negative  Negative       Urobilinogen (UA POC)  0.2 mg/dL  0.2 - 1       Nitrites (UA POC)  Negative  Negative       Leukocyte esterase (UA POC)  Trace  Negative       AMB POC PVR, MEAS,POST-VOID RES,US,NON-IMAGING         Result  Value  Ref Range            PVR  0  cc        CTU 05/07/19   Precontrast images reveal no urinary, biliary, or vascular calcifications.  CT ABDOMEN:  Lung bases: Clear.  Liver: ??Unremarkable  Spleen: Unremarkable   Left Kidney: The kidney is partially malrotated with the collecting system anterior rather than anteromedial. There is a prominent extrarenal pelvis and mild caliectasis. No ureteral dilation is evident. No filling defects are evident. No renal pelvis  or ureteral stricture is apparent.. No UPJ obstruction is present  Right Kidney: Negative.  Pancreas: Negative.  Adrenal glands: Normal.  Stomach, Small Bowel and Colon:  Unremarkable.  No lymphadenopathy evident.  The abdominal  aorta and cava are unremarkable  Peritoneal aces: There is no free fluid or free air.  !!!!!er:The bladder is unremarkable  Uterus and adnexal structures are only partially cystlike hypodensity in the region of the cervix, probably nabothian.  Otherwise negative      IMPRESSION   Partially malrotated left kidney.   Prominent extrarenal pelvis and mild caliectasis in the left kidney. Unremarkable urinary tract otherwise.   No obstructive abnormality is evident.       CT Abd Pelv w Cont 07/30/18  No acute abnormalities. No enlarged parametrial vessels or gonadal veins to suggest pelvic congestion syndrome.   Pelviectasis and minimal caliectasis on the left suggests mild UPJ obstruction. Uncertain etiology. No definite crossing vessel although a CTA would be a more sensitive test to evaluate for arterial extrinsic compression at UPJ. While there does not appear  to be significant decrease in function on the left based on enhancement and size is normal, nuclear medicine renogram would be a more sensitive test to evaluate function of the kidneys.  Physiologic quantity free fluid in the pelvis.      Renal Scan 04/28/2018   IMPRESSION:   1. Delayed transit and excretion of the left kidney secondary to UPJ obstruction   2. Normal function of the right kidney.      Imaging Report Reviewed?  no   Type: n/a  Images Reviewed?     no     Type: N/A   ??   Other Lab Data Reviewed?   no      Vickki Muff PA-C   [facility]       CC: Benay Spice, MD       Medical documentation is provided with the assistance of Lorin Picket, medical scribe for [employer], PA-C  on 09/10/2019

## 2019-09-10 NOTE — Progress Notes (Signed)
Kim [name]  22-Oct-1995    ASSESSMENT:     ICD-10-CM ICD-9-CM    1. Feeling of incomplete bladder emptying  R39.14 788.21 REFERRAL TO [facility]ICS ([facility])   2. Pelvic pain  R10.2 IMO0002 AMB POC URINALYSIS DIP STICK AUTO W/O MICRO      AMB POC PVR, MEAS,POST-VOID RES,US,NON-IMAGING      REFERRAL TO [facility] ([facility])        -Pelvic pain, chronic x1 yr.  Very bothersome.  Fruitless GYN eval.  No obvious cause for pain on CT 07/2018 or recent CTU   CTU 05/07/19: Partially malrotated left kidney. Prominent extrarenal pelvis and mild caliectasis in the left kidney. No obstructive abnormality is evident.     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   Renal Scan 04/28/2018: Split function 50/50. T 1/2 3 minutes on the left.    CT A/P 07/2018: Pelviectasis and minimal caliectasis on the left suggests mild UPJ obstruction   CTU 05/07/19: No obstructive abnormality is evident.      PLAN:    Cysto with Erasmo Downer [place] NP-C per patient preference  Any additional recommendations for further eval from NP [place] appreciated  Urodynamics study recommended. Referral placed.  If no obvious etiology identified to explain patient's symptoms, consider trial gabapentin  Follow-up 2 months for reassessment      Chief Complaint   Patient presents with   ??? Pelvic Pain     HISTORY OF PRESENT ILLNESS:  Kim [name] is a 24 y.o. Caucasian female who presents in follow up for pelvic pain and left flank pain.   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 Renal Scan 04/28/2018 demonstrated a good post-Lasix T1/2 time of 3 mins in the left kidney.    She is currently being seen for worsening lower abdominal/pelvic pain and left flank pain for the last 18+ months. Now having more frequent episodes of pain that is affecting her daily activities. severity of pain varies.  She has had a negative evaluation by GYN. Symptoms not improved with abx for fallopian  tube "fullness." She also reports suprapubic/bladder pain after she finishes voiding that builds as her bladder gets fuller. Describes as stabbing. Her biggest issues is the SP pain she has that prevents her sometimes even from walking. She had to sit down while walking on the beach the other day due to SP discomfort. Denies dysuria. No gross hematuria. No urinary frequency, urgency or incontinence.      No benefit with PFPT at all. She has been performing the exercises as recommended. PT does not feel symptoms are solely MSK in nature.    Review of Systems  Constitutional: Fever: No  Skin: Rash: No  HEENT: Hearing difficulty: No  Eyes: Blurred vision: No  Cardiovascular: Chest pain: No  Respiratory: Shortness of breath: No  Gastrointestinal: Nausea/vomiting: No  Musculoskeletal: Back pain: No  Neurological: Weakness: No  Psychological: Memory loss: No  Comments/additional findings:     Past Medical History:   Diagnosis Date   ??? Bronchitis, chronic ([place])    ??? Chronic kidney disease     congenital left hydronephrosis   ??? Hydronephrosis     congenital left side   ??? Irregular menses    ??? Pelvic pain    ??? Reactive airway disease      Past Surgical History:   Procedure Laterality Date   ??? HX ORTHOPAEDIC  Bilateral     knee surgery   ??? HX OTHER SURGICAL      "clean out" bilaterally   ??? HX PELVIC LAPAROSCOPY     ??? HX TONSIL AND ADENOIDECTOMY       Social History     Tobacco Use   ??? Smoking status: Never Smoker   ??? Smokeless tobacco: Never Used   Substance Use Topics   ??? Alcohol use: No   ??? Drug use: No     No Known Allergies    Family History   Problem Relation Age of Onset   ??? Cancer Paternal Grandmother    ??? Cancer Paternal Grandfather      PHYSICAL EXAMINATION:   Visit Vitals  Ht 5\' 10"  (1.778 m)   Wt 187 lb (84.8 kg)   BMI 26.83 kg/m??      GEN: NAD, alert and oriented  PULM: nl resp effort, no distress  PSYCH: Nl mood and affect    REVIEW OF LABS AND IMAGING:    Results for orders placed or performed in visit on  09/10/19   AMB POC URINALYSIS DIP STICK AUTO W/O MICRO   Result Value Ref Range    Color (UA POC) Yellow     Clarity (UA POC) Clear     Glucose (UA POC) Negative Negative    Bilirubin (UA POC) Negative Negative    Ketones (UA POC) Negative Negative    Specific gravity (UA POC) 1.025 1.001 - 1.035    Blood (UA POC) Negative Negative    pH (UA POC) 7.0 4.6 - 8.0    Protein (UA POC) Negative Negative    Urobilinogen (UA POC) 0.2 mg/dL 0.2 - 1    Nitrites (UA POC) Negative Negative    Leukocyte esterase (UA POC) Trace Negative   AMB POC PVR, MEAS,POST-VOID RES,US,NON-IMAGING   Result Value Ref Range    PVR 0 cc     CTU 05/07/19  Precontrast images reveal no urinary, biliary, or vascular calcifications.  CT ABDOMEN:  Lung bases: Clear.  Liver: ??Unremarkable  Spleen: Unremarkable  Left Kidney: The kidney is partially malrotated with the collecting system anterior rather than anteromedial. There is a prominent extrarenal pelvis and mild caliectasis. No ureteral dilation is evident. No filling defects are evident. No renal pelvis or ureteral stricture is apparent.. No UPJ obstruction is present  Right Kidney: Negative.  Pancreas: Negative.  Adrenal glands: Normal.  Stomach, Small Bowel and Colon: Unremarkable.  No lymphadenopathy evident.  The abdominal aorta and cava are unremarkable  Peritoneal aces: There is no free fluid or free air.  !!!!!er:The bladder is unremarkable  Uterus and adnexal structures are only partially cystlike hypodensity in the region of the cervix, probably nabothian. Otherwise negative    IMPRESSION   Partially malrotated left kidney.   Prominent extrarenal pelvis and mild caliectasis in the left kidney. Unremarkable urinary tract otherwise.   No obstructive abnormality is evident.     CT Abd Pelv w Cont 07/30/18  No acute abnormalities. No enlarged parametrial vessels or gonadal veins to suggest pelvic congestion syndrome.  Pelviectasis and minimal caliectasis on the left suggests mild UPJ  obstruction. Uncertain etiology. No definite crossing vessel although a CTA would be a more sensitive test to evaluate for arterial extrinsic compression at UPJ. While there does not appear to be significant decrease in function on the left based on enhancement and size is normal, nuclear medicine renogram would be a more sensitive test to evaluate function of the  kidneys.  Physiologic quantity free fluid in the pelvis.    Renal Scan 04/28/2018  IMPRESSION:  1. Delayed transit and excretion of the left kidney secondary to UPJ obstruction  2. Normal function of the right kidney.    Imaging Report Reviewed?  no   Type: n/a  Images Reviewed?     no     Type: N/A  ??  Other Lab Data Reviewed?   no    Vickki Muff PA-C  [facility] [place]     CC: Benay Spice, MD     Medical documentation is provided with the assistance of Lorin Picket, medical scribe for [employer], PA-C on 09/10/2019

## 2019-09-17 NOTE — Telephone Encounter (Signed)
Scheduled UDS no C&S needed

## 2019-09-20 ENCOUNTER — Encounter: Attending: Rehabilitative and Restorative Service Providers" | Primary: Family Medicine

## 2019-10-01 ENCOUNTER — Ambulatory Visit: Primary: Family Medicine

## 2019-10-01 ENCOUNTER — Ambulatory Visit: Admit: 2019-10-01 | Discharge: 2019-10-01 | Primary: Family Medicine

## 2019-10-01 DIAGNOSIS — R102 Pelvic and perineal pain: Secondary | ICD-10-CM

## 2019-10-01 MED ORDER — TRIMETHOPRIM-SULFAMETHOXAZOLE 160 MG-800 MG TAB
160-800 mg | ORAL_TABLET | ORAL | 0 refills | Status: AC
Start: 2019-10-01 — End: 2019-10-01

## 2019-10-01 NOTE — Progress Notes (Signed)
 Triage note universal pause    Universal Procedure Pause:    1. Correct patient confirmed:  yes    2. Allergies confirmed:  yes    3. Patient taking antiplatelet medications:  no    4. Patient taking anticoagulants:  no    5. Correct procedure and side confirmed and consent signed:  yes    6. Correct antibiotics confirmed:  yes           [facility]  Urodynamics  Without Video- Fluoroscopy    Name: Kim [name]   Date: 10/01/2019      Kim [name] is a 24 y.o. female who presents today for evaluation. Patient has been referred by B. Noell PA.  Dr. Arsenio is available in clinic as the incident to provider.      Upon arrival, the patient emptied Her bladder using a calibrated uroflow machine to assess bladder volume and time of emptying.       The genital and perineal areas were cleansed with benzalkonium chloride and patient was catheterized with a 14 fr Straight-tip  catheter to assess PVR and absence of urinary tract infection.     A #7 fr Straight-tip  urodynamics catheter was inserted into the bladder to infuse fluid and assess bladder activity and voiding parameters.  A #9 fr catheter was inserted into the rectum/ to assess straining during voiding. Both catheters were connected to water filled lines to transducers that simultaneously measure pressure generated inside and outside the bladder during filling and voiding as well as resulting flow volume and time.      Three patch electrodes are placed, one ground and two perianally which are used to measure the electrical activity of the external urinary sphincter during filling and emptying the bladder.      Infusion with sterile water was started and the patient was asked to voice sensations of pressure, urge and bladder fullness, followed by instructions to void.       Results for orders placed or performed in visit on 10/01/19   AMB POC URINALYSIS DIP STICK AUTO W/O MICRO   Result Value Ref Range    Color (UA POC) Yellow     Clarity (UA POC) Clear      Glucose (UA POC) Negative Negative    Bilirubin (UA POC) Negative Negative    Ketones (UA POC) Negative Negative    Specific gravity (UA POC) 1.025 1.001 - 1.035    Blood (UA POC) Negative Negative    pH (UA POC) 6.5 4.6 - 8.0    Protein (UA POC) Negative Negative    Urobilinogen (UA POC) 0.2 mg/dL 0.2 - 1    Nitrites (UA POC) Negative Negative    Leukocyte esterase (UA POC) Negative Negative       ICD-10-CM ICD-9-CM    1. Pelvic pain  R10.2 IMO0002 AMB POC URINALYSIS DIP STICK AUTO W/O MICRO      PR COMPLEX CYSTOMETROGRAM VOIDING PRESSURE STUDIES      PR VOIDING PRESS STUDY INTRA-ABDOMINAL VOID      PR ANAL/URINARY MUSCLE STUDY      PR ELECTRO-UROFLOWMETRY, FIRST   2. Incomplete bladder emptying  R33.9 788.21 AMB POC URINALYSIS DIP STICK AUTO W/O MICRO      PR COMPLEX CYSTOMETROGRAM VOIDING PRESSURE STUDIES      PR VOIDING PRESS STUDY INTRA-ABDOMINAL VOID      PR ANAL/URINARY MUSCLE STUDY      PR ELECTRO-UROFLOWMETRY, FIRST     Orders Placed This Encounter   .  AMB POC URINALYSIS DIP STICK AUTO W/O MICRO   . ([id number]) - COMPLEX CYSTOMETROGRAM VOIDING PRESSURE STUDIES   . [phone]) - PR VOIDING PRESS STUDY INTRA-ABDOMINAL VOID   . ([id number]) - PR ANAL/URINARY MUSCLE STUDY   . ([id number]) - PR ELECTRO-UROFLOWMETRY, FIRST   . trimethoprim-sulfamethoxazole (BACTRIM DS, SEPTRA DS) 160-800 mg per tablet     Sig: Take 1 Tablet by mouth now for 1 dose.     Dispense:  1 Tablet     Refill:  0     Order Specific Question:   Expiration Date     Answer:   11/13/2023     Order Specific Question:   Lot#     Answer:   [id number]     Order Specific Question:   Manufacturer     Answer:   [employer]       Past Medical History:   Diagnosis Date   . Bronchitis, chronic ([facility])    . Chronic kidney disease     congenital left hydronephrosis   . Hydronephrosis     congenital left side   . Irregular menses    . Pelvic pain    . Reactive airway disease       Past Surgical History:   Procedure Laterality Date   . HX ORTHOPAEDIC Bilateral     knee  surgery   . HX OTHER SURGICAL      clean out bilaterally   . HX PELVIC LAPAROSCOPY     . HX TONSIL AND ADENOIDECTOMY             Voiding Diary: not completed                Uroflometry  Post Void Residual 75 ml clear yellow urine obtained    Comments: Pt voided <2 hours prior no desire to void.      Cystometrogram  Sensation Normal   First sensation 3 ml   Strong desire 158 ml   Capacity 507 ml   Compliance: normal     Detrusor overactivity: no     Leakage: no     Micturition  Voided Volume: 216 ml   Detrusor Pressure: At peak flow rate: 23 cm H2O Maximum: 50 cm H2O   Duration Contraction: >60sec continuous    Post Void Contraction: no    Valsalva: no   External Sphincter: unrelaxed   Max Flow Rate: 15.1 ml/sec   Average Flow Rate 6.3 ml/sec   Voiding Time: 1:20 minutes   Flow Pattern: continuous   Post Void Residual: Obtained 40ml of urine with 20 cc syringe via Pves catheter, as I was emptying her bladder noted large amount of urine on chux pad underneath uroflow leading me to believe she did empty well today.    Comments: Patient was given Bactrim DS PO per UDS protocol and confirmed she is not on any urologic medications. Per CMG patient has a large capacity, compliant bladder with normal sensations and no DO. She voided with a prolonged continuous pressure detrusor contraction, low continuous flow, and unrelaxed EUS and low PVR.      Brief history (present illness, neurological, S/S): None    Relevant surgical history: Not Applicable    PE: Deferred    Cystoscopy: Yes    Positive for Cystoscopy: Other not yet performed       The Patient Instruction Sheet was reviewed with Pt, and was understood.    [name], LPN    Chart reviewed and agree  VBrugh      I have reviewed the above study and tracings and agree with above.    Delon Stabs, MD

## 2019-10-04 ENCOUNTER — Encounter: Primary: Family Medicine

## 2019-10-08 LAB — AMB POC URINALYSIS DIP STICK AUTO W/O MICRO
Bilirubin (UA POC): NEGATIVE
Bilirubin, Urine, POC: NEGATIVE
Blood (UA POC): NEGATIVE
Blood (UA POC): NEGATIVE
Glucose (UA POC): NEGATIVE
Glucose, Urine, POC: NEGATIVE
Ketones (UA POC): NEGATIVE
Ketones, Urine, POC: NEGATIVE
Leukocyte Esterase, Urine, POC: NEGATIVE
Leukocyte esterase (UA POC): NEGATIVE
Nitrite, Urine, POC: NEGATIVE
Nitrites (UA POC): NEGATIVE
Protein (UA POC): NEGATIVE
Protein, Urine, POC: NEGATIVE
Specific Gravity, Urine, POC: 1.025 NA (ref 1.001–1.035)
Specific gravity (UA POC): 1.025 (ref 1.001–1.035)
Urobilinogen (UA POC): 0.2 (ref 0.2–1)
Urobilinogen, POC: 0.2 (ref 0.2–1)
pH (UA POC): 6.5 (ref 4.6–8.0)
pH, Urine, POC: 6.5 NA (ref 4.6–8.0)

## 2019-11-01 ENCOUNTER — Ambulatory Visit: Attending: Nurse Practitioner | Primary: Family Medicine

## 2019-11-01 ENCOUNTER — Encounter: Primary: Family Medicine

## 2019-11-01 ENCOUNTER — Ambulatory Visit: Admit: 2019-11-01 | Discharge: 2019-11-01 | Attending: Nurse Practitioner | Primary: Family Medicine

## 2019-11-01 DIAGNOSIS — R102 Pelvic and perineal pain: Secondary | ICD-10-CM

## 2019-11-01 LAB — AMB POC URINALYSIS DIP STICK AUTO W/O MICRO
Bilirubin (UA POC): NEGATIVE
Bilirubin, Urine, POC: NEGATIVE
Glucose (UA POC): NEGATIVE
Glucose, Urine, POC: NEGATIVE
Ketones (UA POC): NEGATIVE
Ketones, Urine, POC: NEGATIVE
Nitrite, Urine, POC: NEGATIVE
Nitrites (UA POC): NEGATIVE
Protein (UA POC): NEGATIVE
Protein, Urine, POC: NEGATIVE
Specific Gravity, Urine, POC: 1.025 NA (ref 1.001–1.035)
Specific gravity (UA POC): 1.025 (ref 1.001–1.035)
Urobilinogen (UA POC): 0.2 (ref 0.2–1)
Urobilinogen, POC: 0.2 (ref 0.2–1)
pH (UA POC): 7.5 (ref 4.6–8.0)
pH, Urine, POC: 7.5 NA (ref 4.6–8.0)

## 2019-11-01 MED ORDER — URIBEL 118 MG-10 MG-40.8 MG-36 MG CAPSULE
ORAL_CAPSULE | Freq: Four times a day (QID) | ORAL | 3 refills | Status: DC | PRN
Start: 2019-11-01 — End: 2020-01-14

## 2019-11-01 NOTE — Progress Notes (Signed)
Progress Notes by [place], Caryl Fate F, NP at 11/01/19 1287                Author: [place], Ravleen Ries F, NP  Service: --  Author Type: Nurse Practitioner       Filed: 11/01/19 1036  Encounter Date: 11/01/2019  Status: Signed          Editor: [place], Inioluwa Boulay F, NP (Nurse Practitioner)                       [name]   August 13, 1995      ASSESSMENT:              ICD-10-CM  ICD-9-CM             1.  Pelvic pain   R10.2  IMO0002  AMB POC URINALYSIS DIP STICK AUTO W/O MICRO                CYSTOURETHROSCOPY           CANCELED: AMB POC PVR, MEAS,POST-VOID RES,US,NON-IMAGING           2.  Incomplete bladder emptying   R33.9  788.21  AMB POC URINALYSIS DIP STICK AUTO W/O MICRO                CYSTOURETHROSCOPY           CANCELED: AMB POC PVR, MEAS,POST-VOID RES,US,NON-IMAGING           3.  Pelvic floor dysfunction   M62.89  618.83  CYSTOURETHROSCOPY            -Pelvic pain, chronic x1 yr.  Very bothersome.  Fruitless GYN eval.  No obvious cause for pain on CT 07/2018 or recent CTU    CTU 05/07/19: Partially malrotated left kidney. Prominent extrarenal pelvis and mild caliectasis in the left kidney. No obstructive abnormality is evident.      No benefit with PFPT- SP discomfort prevents physical activity    UDS reviewed revealing: Per CMG patient has a large capacity, compliant bladder with normal sensations and no DO. She voided with a prolonged continuous pressure detrusor contraction, low continuous flow, and unrelaxed EUS and low PVR.        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    Renal Scan 04/28/2018: Split function 50/50. T 1/2 3 minutes on the left.     CT A/P 07/2018: Pelviectasis and minimal caliectasis on the left suggests mild UPJ obstruction    CTU 05/07/19: No obstructive abnormality is evident.        PLAN:     Cysto today normal    UA today negative   Bladder irritants list provided    Start behavioral modifications including: timed voiding every 2-3 hours, limiting bladder irritants.    Recommend bladder  diary to identify foods/drinks that may trigger pain. Bring to follow up.    Continue HEP as tolerated.    Discussed use of Uribel and vaginal valium, will start Uribel PRN, rx sent to pharmacy.         Chief Complaint       Patient presents with        ?  Pelvic Pain             Pt is here for Cysto         HISTORY OF PRESENT ILLNESS:   Kim [name] is a 24 y.o. Caucasian  female who presents today for cysto. 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  Patient states that she continues to have bothersome lower pelvic pain, started 1.5 yrs ago. Reports pain after she urinates and randomly. Minimal improvement with heat. Has IUD, only has random menses and pain not noticeably worse at this time. Drinks  water. Asymptomatic for infection. Denies flank pain, gross hematuria, dysuria, or frequency. No f/c/n/v.       PRIOR HISTORY:    09/10/19 per Vickki Muff, PA: Presents in follow up for pelvic pain and left flank pain.    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 Renal Scan 04/28/2018 demonstrated a good post-Lasix T1/2 time of 3 mins in the left kidney.      She is currently being seen for worsening lower abdominal/pelvic pain and left flank pain for the last 18+ months. Now having more frequent episodes of pain that is affecting her daily activities. severity of pain varies.  She has had a negative evaluation  by GYN. Symptoms not improved with abx for fallopian tube "fullness." She also reports suprapubic/bladder pain after she finishes voiding that builds as her bladder gets fuller. Describes as stabbing. Her biggest issues is the SP pain she has that prevents  her sometimes even from walking. She had to sit down while walking on the beach the other day due to SP discomfort. Denies dysuria. No gross hematuria. No urinary frequency, urgency or incontinence.        No benefit with PFPT at all. She has been performing the exercises as recommended. PT does not feel symptoms are solely MSK in nature.      Review  of Systems   Constitutional: Fever: No   Skin: Rash: No   HEENT: Hearing difficulty: No   Eyes: Blurred vision: No   Cardiovascular: Chest pain: No   Respiratory: Shortness of breath: No   Gastrointestinal: Nausea/vomiting: No   Musculoskeletal: Back pain: No   Neurological: Weakness: No   Psychological: Memory loss: No   Comments/additional findings:         Past Medical History:        Diagnosis  Date         ?  Bronchitis, chronic ([facility])       ?  Chronic kidney disease            congenital left hydronephrosis         ?  Hydronephrosis            congenital left side         ?  Irregular menses       ?  Pelvic pain           ?  Reactive airway disease            Past Surgical History:         Procedure  Laterality  Date          ?  HX ORTHOPAEDIC  Bilateral            knee surgery          ?  HX OTHER SURGICAL              "clean out" bilaterally          ?  HX PELVIC LAPAROSCOPY              ?  HX TONSIL AND ADENOIDECTOMY              Social History  Tobacco Use         ?  Smoking status:  Never Smoker     ?  Smokeless tobacco:  Never Used       Substance Use Topics         ?  Alcohol use:  No         ?  Drug use:  No        No Known Allergies        Family History         Problem  Relation  Age of Onset          ?  Cancer  Paternal Grandmother            ?  Cancer  Paternal Grandfather          PHYSICAL EXAMINATION:    Visit Vitals      Resp  16     Ht  5\' 10"  (1.778 m)     Wt  187 lb (84.8 kg)        BMI  26.83 kg/m??         GEN: NAD, alert and oriented   GU female 11/01/19: NEFG on cysto         REVIEW OF LABS AND IMAGING:       Results for orders placed or performed in visit on 11/01/19     AMB POC URINALYSIS DIP STICK AUTO W/O MICRO         Result  Value  Ref Range            Color (UA POC)  Yellow         Clarity (UA POC)  Clear         Glucose (UA POC)  Negative  Negative       Bilirubin (UA POC)  Negative  Negative       Ketones (UA POC)  Negative  Negative       Specific gravity (UA POC)  1.025   1.001 - 1.035       Blood (UA POC)  Trace  Negative       pH (UA POC)  7.5  4.6 - 8.0       Protein (UA POC)  Negative  Negative       Urobilinogen (UA POC)  0.2 mg/dL  0.2 - 1       Nitrites (UA POC)  Negative  Negative            Leukocyte esterase (UA POC)  Trace  Negative        UDS 10/01/19   Voiding Diary: not completed        Uroflometry      Post Void Residual  75 ml clear yellow urine obtained        Comments: Pt voided <2 hours prior no desire to void.         Cystometrogram      Sensation  Normal        First sensation  3 ml        Strong desire  158 ml        Capacity  507 ml         Compliance: normal             Detrusor overactivity: no           Leakage: no        Micturition      Voided Volume:  216 ml  Detrusor Pressure:  At peak flow rate: 23 cm H2O  Maximum: 50 cm H2O         Duration Contraction:  >60sec continuous           Post Void Contraction:  no          Valsalva:  no        External Sphincter:  unrelaxed        Max Flow Rate:  15.1 ml/sec        Average Flow Rate  6.3 ml/sec        Voiding Time:  1:20 minutes        Flow Pattern:  continuous        Post Void Residual:  Obtained 40ml of urine with 20 cc syringe via Pves catheter, as I was emptying her bladder noted large amount of urine on chux pad underneath uroflow leading  me to believe she did empty well today.        Comments: Patient was given Bactrim DS PO per UDS protocol and confirmed she is not on any urologic medications. Per CMG patient has a large capacity, compliant  bladder with normal sensations and no DO. She voided with a prolonged continuous pressure detrusor contraction, low continuous flow, and unrelaxed EUS and low PVR.         CTU 05/07/19   Precontrast images reveal no urinary, biliary, or vascular calcifications.  CT ABDOMEN:  Lung bases: Clear.  Liver: ??Unremarkable  Spleen: Unremarkable   Left Kidney: The kidney is partially malrotated with the collecting system anterior rather than anteromedial. There is a  prominent extrarenal pelvis and mild caliectasis. No ureteral dilation is evident. No filling defects are evident. No renal pelvis  or ureteral stricture is apparent.. No UPJ obstruction is present  Right Kidney: Negative.  Pancreas: Negative.  Adrenal glands: Normal.  Stomach, Small Bowel and Colon: Unremarkable.  No lymphadenopathy evident.  The abdominal  aorta and cava are unremarkable  Peritoneal aces: There is no free fluid or free air.  !!!!!er:The bladder is unremarkable  Uterus and adnexal structures are only partially cystlike hypodensity in the region of the cervix, probably nabothian.  Otherwise negative      IMPRESSION   Partially malrotated left kidney.   Prominent extrarenal pelvis and mild caliectasis in the left kidney. Unremarkable urinary tract otherwise.   No obstructive abnormality is evident.       CT Abd Pelv w Cont 07/30/18  No acute abnormalities. No enlarged parametrial vessels or gonadal veins to suggest pelvic congestion syndrome.   Pelviectasis and minimal caliectasis on the left suggests mild UPJ obstruction. Uncertain etiology. No definite crossing vessel although a CTA would be a more sensitive test to evaluate for arterial extrinsic compression at UPJ. While there does not appear  to be significant decrease in function on the left based on enhancement and size is normal, nuclear medicine renogram would be a more sensitive test to evaluate function of the kidneys.  Physiologic quantity free fluid in the pelvis.      Renal Scan 04/28/2018   IMPRESSION:   1. Delayed transit and excretion of the left kidney secondary to UPJ obstruction   2. Normal function of the right kidney.      Imaging Report Reviewed?  no   Type: n/a   Images Reviewed?     no     Type: N/A   ??   Other Lab Data Reviewed?   no  Eben Burow. [place], NP-C   [facility]   a division of [facility] [place]      [address]   [place], [place] [address]    [phone]   [phone] fax         CC: Benay Spice, MD

## 2019-11-11 ENCOUNTER — Ambulatory Visit: Attending: Urology | Primary: Family Medicine

## 2019-11-11 ENCOUNTER — Ambulatory Visit: Admit: 2019-11-11 | Discharge: 2019-11-11 | Attending: Urology | Primary: Family Medicine

## 2019-11-11 DIAGNOSIS — R102 Pelvic and perineal pain: Secondary | ICD-10-CM

## 2019-11-11 MED ORDER — GABAPENTIN 300 MG CAP
300 mg | ORAL_CAPSULE | Freq: Three times a day (TID) | ORAL | 5 refills | Status: DC
Start: 2019-11-11 — End: 2020-02-18

## 2019-11-11 NOTE — Progress Notes (Signed)
Progress Notes by Ruben Reason, MD at 11/11/19 1345                Author: Ruben Reason, MD  Service: --  Author Type: Physician       Filed: 11/11/19 1701  Encounter Date: 11/11/2019  Status: Signed          Editor: Ruben Reason, MD (Physician)                       Kim [name]   07/16/1995      ASSESSMENT:              ICD-10-CM  ICD-9-CM             1.  Pelvic pain   R10.2  IMO0002  gabapentin (NEURONTIN) 300 mg capsule           2.  UPJ (ureteropelvic junction) obstruction   N13.5  593.4              -Pelvic pain, chronic x1 yr.  Bothersome.  Fruitless GYN eval.  No obvious cause for pain on prior imaging including CT urogram.  Urodynamics suggests a degree of dysfunctional voiding pattern with a high PVR.  However she is not bothered by a sense of  straining to void or incomplete emptying.  Pelvic floor physical therapy did not seem to help symptoms.  Uribel has not helped.  I recommend avoid spicy foods and other bladder irritants which were reviewed with Kim [name] previously.  I recommend  a trial of Neurontin and follow-up with Kim [name] for further management of pelvic pain syndrome.        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  Resolved.  Reimage in 1 year.      PLAN:     Trial Neurontin 300 mg 3 times a day for pelvic pain syndrome.  Follow-up with Kim [name] next 7 or 8 weeks for further management.   FU in 1 year with Kim [name] with RUS prior           Chief Complaint       Patient presents with        ?  Pelvic Pain        HISTORY OF PRESENT ILLNESS:   Kim [name] is a 24 y.o. Caucasian  female who presents in follow up for pelvic pain.    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 Renal Scan 04/28/2018 demonstrated a good post-Lasix T1/2 time of 3 mins in the left kidney.      Since last visit she was sent for pelvic floor physical therapy to address pelvic pain.  She underwent cystoscopy with Kim [name] which was negative for bladder pathology.   She had urodynamic study as well which showed a high postvoid residual.   Pt is still noting suprapubic pain that is aggravated by exercise. She feels the pain when her bladder is full and after emptying.  Denies flank pain.  No gross hematuria.  Reports good FOS and does not need to strain to void.  She has not cut out spicy  foods.  She has tried [place] and states it did not help.  She states pelvic floor physical therapy did not help.      Review of Systems   Constitutional: Fever:    Skin: Rash:    HEENT: Hearing difficulty:    Eyes: Blurred vision:    Cardiovascular: Chest pain:  Respiratory: Shortness of breath:    Gastrointestinal: Nausea/vomiting:    Musculoskeletal: Back pain:    Neurological: Weakness:    Psychological: Memory loss:    Comments/additional findings:         Past Medical History:        Diagnosis  Date         ?  Bronchitis, chronic (HCC)       ?  Chronic kidney disease            congenital left hydronephrosis         ?  Hydronephrosis            congenital left side         ?  Irregular menses       ?  Pelvic pain           ?  Reactive airway disease            Past Surgical History:         Procedure  Laterality  Date          ?  HX ORTHOPAEDIC  Bilateral            knee surgery          ?  HX OTHER SURGICAL              "clean out" bilaterally          ?  HX PELVIC LAPAROSCOPY              ?  HX TONSIL AND ADENOIDECTOMY              Social History          Tobacco Use         ?  Smoking status:  Never Smoker     ?  Smokeless tobacco:  Never Used       Substance Use Topics         ?  Alcohol use:  No         ?  Drug use:  No        No Known Allergies        Family History         Problem  Relation  Age of Onset          ?  Cancer  Paternal Grandmother            ?  Cancer  Paternal Grandfather          PHYSICAL EXAMINATION:    There were no vitals taken for this visit.       GEN: NAD, alert and oriented   PULM: nl resp effort, no distress   PSYCH: Nl mood and affect         REVIEW OF  LABS AND IMAGING:       Results for orders placed or performed in visit on 11/01/19     AMB POC URINALYSIS DIP STICK AUTO W/O MICRO         Result  Value  Ref Range            Color (UA POC)  Yellow         Clarity (UA POC)  Clear         Glucose (UA POC)  Negative  Negative       Bilirubin (UA POC)  Negative  Negative       Ketones (UA POC)  Negative  Negative  Specific gravity (UA POC)  1.025  1.001 - 1.035       Blood (UA POC)  Trace  Negative       pH (UA POC)  7.5  4.6 - 8.0       Protein (UA POC)  Negative  Negative       Urobilinogen (UA POC)  0.2 mg/dL  0.2 - 1       Nitrites (UA POC)  Negative  Negative            Leukocyte esterase (UA POC)  Trace  Negative        CTU 05/07/19   Precontrast images reveal no urinary, biliary, or vascular calcifications.  CT ABDOMEN:  Lung bases: Clear.  Liver: ??Unremarkable  Spleen: Unremarkable   Left Kidney: The kidney is partially malrotated with the collecting system anterior rather than anteromedial. There is a prominent extrarenal pelvis and mild caliectasis. No ureteral dilation is evident. No filling defects are evident. No renal pelvis  or ureteral stricture is apparent.. No UPJ obstruction is present  Right Kidney: Negative.  Pancreas: Negative.  Adrenal glands: Normal.  Stomach, Small Bowel and Colon: Unremarkable.  No lymphadenopathy evident.  The abdominal  aorta and cava are unremarkable  Peritoneal aces: There is no free fluid or free air.  !!!!!er:The bladder is unremarkable  Uterus and adnexal structures are only partially cystlike hypodensity in the region of the cervix, probably nabothian.  Otherwise negative      IMPRESSION   Partially malrotated left kidney.   Prominent extrarenal pelvis and mild caliectasis in the left kidney. Unremarkable urinary tract otherwise.   No obstructive abnormality is evident.       Renal Scan 04/28/2018   IMPRESSION:   1. Delayed transit and excretion of the left kidney secondary to UPJ obstruction   2. Normal function  of the right kidney.         Imaging Report Reviewed?  no   Images Reviewed?     no           Other Lab/Data Reviewed?    Urine dynamics reviewed.  Notes from Kim [name] are reviewed.         Ruben Reason, MD   [facility]         CC: Kim Hartmann Oran Rein, MD

## 2020-01-14 ENCOUNTER — Ambulatory Visit: Attending: Nurse Practitioner | Primary: Family Medicine

## 2020-01-14 ENCOUNTER — Ambulatory Visit: Admit: 2020-01-14 | Discharge: 2020-01-14 | Attending: Nurse Practitioner | Primary: Family Medicine

## 2020-01-14 DIAGNOSIS — R102 Pelvic and perineal pain: Secondary | ICD-10-CM

## 2020-01-14 LAB — AMB POC URINALYSIS DIP STICK AUTO W/O MICRO
Bilirubin (UA POC): NEGATIVE
Bilirubin, Urine, POC: NEGATIVE
Blood (UA POC): NEGATIVE
Blood (UA POC): NEGATIVE
Glucose (UA POC): NEGATIVE
Glucose, Urine, POC: NEGATIVE
Ketones (UA POC): NEGATIVE
Ketones, Urine, POC: NEGATIVE
Nitrite, Urine, POC: NEGATIVE
Nitrites (UA POC): NEGATIVE
Protein (UA POC): NEGATIVE
Protein, Urine, POC: NEGATIVE
Specific Gravity, Urine, POC: 1.025 NA (ref 1.001–1.035)
Specific gravity (UA POC): 1.025 (ref 1.001–1.035)
Urobilinogen (UA POC): 0.2 (ref 0.2–1)
Urobilinogen, POC: 0.2 (ref 0.2–1)
pH (UA POC): 5.5 (ref 4.6–8.0)
pH, Urine, POC: 5.5 NA (ref 4.6–8.0)

## 2020-01-14 LAB — AMB POC PVR, MEAS,POST-VOID RES,US,NON-IMAGING
PVR POC: 0 cc
PVR: 0 cc

## 2020-01-14 MED ORDER — HYDROXYZINE PAMOATE 25 MG CAP
25 mg | ORAL_CAPSULE | Freq: Every day | ORAL | 3 refills | Status: AC | PRN
Start: 2020-01-14 — End: 2020-02-18

## 2020-01-14 NOTE — Progress Notes (Signed)
Progress Notes by [name], Alfonse Garringer F, NP at 01/14/20 2355                Author: [place], Labrenda Lasky F, NP  Service: --  Author Type: Nurse Practitioner       Filed: 01/14/20 1153  Encounter Date: 01/14/2020  Status: Signed          Editor: [name], Pearl Berlinger F, NP (Nurse Practitioner)                       Kim [name]   1995-08-11      ASSESSMENT:              ICD-10-CM  ICD-9-CM             1.  Pelvic pain   R10.2  IMO0002  AMB POC URINALYSIS DIP STICK AUTO W/O MICRO                AMB POC PVR, MEAS,POST-VOID RES,US,NON-IMAGING           2.  Pelvic floor dysfunction   M62.89  618.83             3.  Interstitial cystitis   N30.10  595.1              -Pelvic pain, chronic x1 yr.  Very bothersome.  Fruitless GYN eval.  No obvious cause for pain on CT 07/2018 or recent CTU    CTU 05/07/19: Partially malrotated left kidney. Prominent extrarenal pelvis and mild caliectasis in the left kidney. No obstructive abnormality is evident.      No benefit with PFPT- SP discomfort prevents physical activity    UDS reviewed revealing: Per [facility] patient has a large capacity, compliant bladder with normal sensations and no DO. She voided with a prolonged continuous pressure detrusor contraction, low continuous flow, and unrelaxed EUS and low PVR.        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    Renal Scan 04/28/2018: Split function 50/50. T 1/2 3 minutes on the left.     CT A/P 07/2018: Pelviectasis and minimal caliectasis on the left suggests mild UPJ obstruction    CTU 05/07/19: No obstructive abnormality is evident.      Cysto 11/01/19 normal       We had a long discussion regarding the possibility of IC. IC is a spectrum disease in which patients present with multiple symptoms of urgency or pain related to bladder filling, storage or emptying of urine. Evaluation may including UA, cystoscopy and  urodynamics. Frequently, patients develop pelvic floor dysfunction as a result of IC. At times, physical therapy is also required for  treatment. Typically, IC is treated with dietary modifications, antihistamine therapy, neuromodulating agents and bladder  retraining. Bladder analgesics and Elmiron have also been found to be helpful. If conservative therapies fail, surgical options including cystoscopy =/- hydrodistension or fulguration, Interstim or Botox have been should to provide benefit.       PLAN:     Continue behavioral modifications including: timed voiding every 2-3 hours, limiting bladder irritants. Can use Prelief PRN.    Start Atarax 25mg  nightly, rx sent to pharmacy    Consider Valium vaginal suppositories if no benefit.    Recommend consistent HEP    Stop Neurontin    IC diet provided    [facility] Brochure on IC provided.       I have spent 40 minutes of care time involved in preparing to see  the patient, obtaining and/or reviewing separately obtained history, discussing with other health care professionals, counseling/educating the patient/family/caregiver, ordering medications/tests/procedures,  and documenting in the health record. This was all completed on the day of patient's visit.            Chief Complaint       Patient presents with        ?  Pelvic Pain             Pt returns to office for [name]/u d/c Uribel medicaiton is not working, pt was prescribe Neurotin however unable to tolerated taking it x3 times a day  makes her sleepy/drowsy patient.          HISTORY OF PRESENT ILLNESS:   Kim [name] is a 24 y.o. Caucasian  female who presents today for follow up of pelvic pain. EP of Kim [name], [place].       Since last visit she was prescribed Neurontin. States it has caused drowsiness so she is taking at night, no benefit. Has completed bladder diary but left it at home. The main bladder irritant she eliminated was spicy foods, has not seen a benefit yet.  She also has decreased caffeine. Only drinks water, occ Gatorade. After she graduated college is when pain started, reports decreased stress after graduating. Previously  prescribed Elimiron which she did not start due to cost and potential SE. Continues  to have pain when she exercises, hesitant to start exercising due to pain it causes. Denies current gross hematuria or flank pain. No [name]/c/n/v.       PRIOR HISTORY:    11/11/19 per Kim [name]: She has a history of left UPJO, s/p left pyeloplasty 02/2015. Renal Scan 04/28/2018 demonstrated a good post-Lasix T1/2 time of 3 mins in the left kidney.      Since last visit she was sent for pelvic floor physical therapy to address pelvic pain.  She underwent cystoscopy with Kim [name] which was negative for bladder pathology.  She had urodynamic study as well which showed a high postvoid residual.   Pt is still noting suprapubic pain that is aggravated by exercise. She feels the pain when her bladder is full and after emptying.  Denies flank pain.  No gross hematuria.  Reports good FOS and does not need to strain to void.  She has not cut out spicy  foods.  She has tried [place] and states it did not help.  She states pelvic floor physical therapy did not help.     11/01/19: Patient states that she continues to have bothersome lower pelvic pain, started 1.5 yrs ago. Reports pain after she urinates and randomly. Minimal improvement with heat. Has IUD, only has random menses and pain not noticeably worse at this  time. Drinks water. Asymptomatic for infection. Denies flank pain, gross hematuria, dysuria, or frequency. No [name]/c/n/v.       09/10/19 per Kim Muff, PA: Presents in follow up for pelvic pain and left flank pain.    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 Renal Scan 04/28/2018 demonstrated a good post-Lasix T1/2 time of 3 mins in the left kidney.      She is currently being seen for worsening lower abdominal/pelvic pain and left flank pain for the last 18+ months. Now having more frequent episodes of pain that is affecting her daily activities. severity of pain varies.  She has had a negative evaluation  by  GYN. Symptoms not improved with abx for fallopian  tube "fullness." She also reports suprapubic/bladder pain after she finishes voiding that builds as her bladder gets fuller. Describes as stabbing. Her biggest issues is the SP pain she has that prevents  her sometimes even from walking. She had to sit down while walking on the beach the other day due to SP discomfort. Denies dysuria. No gross hematuria. No urinary frequency, urgency or incontinence.        No benefit with PFPT at all. She has been performing the exercises as recommended. PT does not feel symptoms are solely MSK in nature.      Review of Systems   Constitutional: Fever: No   Skin: Rash: No   HEENT: Hearing difficulty: No   Eyes: Blurred vision: No   Cardiovascular: Chest pain: No   Respiratory: Shortness of breath: No   Gastrointestinal: Nausea/vomiting: No   Musculoskeletal: Back pain: No   Neurological: Weakness: No   Psychological: Memory loss: No   Comments/additional findings:         Past Medical History:        Diagnosis  Date         ?  Bronchitis, chronic (HCC)       ?  Chronic kidney disease            congenital left hydronephrosis         ?  Hydronephrosis            congenital left side         ?  Irregular menses       ?  Pelvic pain           ?  Reactive airway disease            Past Surgical History:         Procedure  Laterality  Date          ?  HX ORTHOPAEDIC  Bilateral            knee surgery          ?  HX OTHER SURGICAL              "clean out" bilaterally          ?  HX PELVIC LAPAROSCOPY              ?  HX TONSIL AND ADENOIDECTOMY              Social History          Tobacco Use         ?  Smoking status:  Never Smoker     ?  Smokeless tobacco:  Never Used       Substance Use Topics         ?  Alcohol use:  No         ?  Drug use:  No        No Known Allergies        Family History         Problem  Relation  Age of Onset          ?  Cancer  Paternal Grandmother            ?  Cancer  Paternal Grandfather          PHYSICAL  EXAMINATION:    Visit Vitals      Resp  16     Ht  5\' 10"  (1.778 m)     Wt  187 lb (84.8 kg)        BMI  26.83 kg/m??         GEN: NAD, alert and oriented   GU female 11/01/19: NEFG on cysto         REVIEW OF LABS AND IMAGING:       Results for orders placed or performed in visit on 01/14/20     AMB POC URINALYSIS DIP STICK AUTO W/O MICRO         Result  Value  Ref Range            Color (UA POC)  Yellow         Clarity (UA POC)  Clear         Glucose (UA POC)  Negative  Negative       Bilirubin (UA POC)  Negative  Negative       Ketones (UA POC)  Negative  Negative       Specific gravity (UA POC)  1.025  1.001 - 1.035       Blood (UA POC)  Negative  Negative       pH (UA POC)  5.5  4.6 - 8.0       Protein (UA POC)  Negative  Negative       Urobilinogen (UA POC)  0.2 mg/dL  0.2 - 1       Nitrites (UA POC)  Negative  Negative       Leukocyte esterase (UA POC)  1+  Negative       AMB POC PVR, MEAS,POST-VOID RES,US,NON-IMAGING         Result  Value  Ref Range            PVR  0  cc        UDS 10/01/19   Voiding Diary: not completed        Uroflometry      Post Void Residual  75 ml clear yellow urine obtained        Comments: Pt voided <2 hours prior no desire to void.         Cystometrogram      Sensation  Normal        First sensation  3 ml        Strong desire  158 ml        Capacity  507 ml         Compliance: normal             Detrusor overactivity: no           Leakage: no        Micturition      Voided Volume:  216 ml         Detrusor Pressure:  At peak flow rate: 23 cm H2O  Maximum: 50 cm H2O         Duration Contraction:  >60sec continuous           Post Void Contraction:  no          Valsalva:  no        External Sphincter:  unrelaxed        Max Flow Rate:  15.1 ml/sec        Average Flow Rate  6.3 ml/sec        Voiding Time:  1:20 minutes        Flow Pattern:  continuous        Post Void Residual:  Obtained 66ml  of urine with 20 cc syringe via Pves catheter, as I was emptying her bladder noted large amount of  urine on chux pad underneath uroflow leading  me to believe she did empty well today.        Comments: Patient was given Bactrim DS PO per UDS protocol and confirmed she is not on any urologic medications. Per [facility] patient has a large capacity, compliant  bladder with normal sensations and no DO. She voided with a prolonged continuous pressure detrusor contraction, low continuous flow, and unrelaxed EUS and low PVR.         CTU 05/07/19   Precontrast images reveal no urinary, biliary, or vascular calcifications.  CT ABDOMEN:  Lung bases: Clear.  Liver: ??Unremarkable  Spleen: Unremarkable   Left Kidney: The kidney is partially malrotated with the collecting system anterior rather than anteromedial. There is a prominent extrarenal pelvis and mild caliectasis. No ureteral dilation is evident. No filling defects are evident. No renal pelvis  or ureteral stricture is apparent.. No UPJ obstruction is present  Right Kidney: Negative.  Pancreas: Negative.  Adrenal glands: Normal.  Stomach, Small Bowel and Colon: Unremarkable.  No lymphadenopathy evident.  The abdominal  aorta and cava are unremarkable  Peritoneal aces: There is no free fluid or free air.  The bladder is unremarkable  Uterus and adnexal structures are only partially cystlike hypodensity in the region of the cervix, probably nabothian. Otherwise  negative      IMPRESSION   Partially malrotated left kidney.   Prominent extrarenal pelvis and mild caliectasis in the left kidney. Unremarkable urinary tract otherwise.   No obstructive abnormality is evident.       CT Abd Pelv w Cont 07/30/18  No acute abnormalities. No enlarged parametrial vessels or gonadal veins to suggest pelvic congestion syndrome.   Pelviectasis and minimal caliectasis on the left suggests mild UPJ obstruction. Uncertain etiology. No definite crossing vessel although a CTA would be a more sensitive test to evaluate for arterial extrinsic compression at UPJ. While there does not appear  to be  significant decrease in function on the left based on enhancement and size is normal, nuclear medicine renogram would be a more sensitive test to evaluate function of the kidneys.  Physiologic quantity free fluid in the pelvis.      Renal Scan 04/28/2018   IMPRESSION:   1. Delayed transit and excretion of the left kidney secondary to UPJ obstruction   2. Normal function of the right kidney.      Imaging Report Reviewed?  no   Type: n/a   Images Reviewed?     no     Type: N/A   ??   Other Lab Data Reviewed?   no      Eben Burow. [place], NP-C   [facility]   a division of [facility] [place]      [address]   [place], [place] [address]   [phone]   [phone] fax         CC: Benay Spice, MD

## 2020-02-18 ENCOUNTER — Ambulatory Visit: Attending: Nurse Practitioner | Primary: Family Medicine

## 2020-02-18 ENCOUNTER — Ambulatory Visit: Admit: 2020-02-18 | Discharge: 2020-02-18 | Attending: Nurse Practitioner | Primary: Family Medicine

## 2020-02-18 DIAGNOSIS — R102 Pelvic and perineal pain: Secondary | ICD-10-CM

## 2020-02-18 LAB — AMB POC URINALYSIS DIP STICK AUTO W/O MICRO
Bilirubin (UA POC): NEGATIVE
Bilirubin, Urine, POC: NEGATIVE
Glucose (UA POC): NEGATIVE
Glucose, Urine, POC: NEGATIVE
Ketones (UA POC): NEGATIVE
Ketones, Urine, POC: NEGATIVE
Nitrite, Urine, POC: NEGATIVE
Nitrites (UA POC): NEGATIVE
Specific Gravity, Urine, POC: 1.025 NA (ref 1.001–1.035)
Specific gravity (UA POC): 1.025 (ref 1.001–1.035)
Urobilinogen (UA POC): 0.2 (ref 0.2–1)
Urobilinogen, POC: 0.2 (ref 0.2–1)
pH (UA POC): 5.5 (ref 4.6–8.0)
pH, Urine, POC: 5.5 NA (ref 4.6–8.0)

## 2020-02-18 LAB — AMB POC PVR, MEAS,POST-VOID RES,US,NON-IMAGING
PVR POC: 0 cc
PVR: 0 cc

## 2020-02-18 MED ORDER — OTHER(NON-FORMULARY)
5 refills | Status: AC
Start: 2020-02-18 — End: ?

## 2020-02-18 NOTE — Progress Notes (Signed)
Progress Notes by [place], Amika Tassin F, NP at 02/18/20 2197                Author: [place], Daune Colgate F, NP  Service: --  Author Type: Nurse Practitioner       Filed: 02/18/20 1134  Encounter Date: 02/18/2020  Status: Signed          Editor: [place], Mitchelle Sultan F, NP (Nurse Practitioner)                       Kim [name]   24-Dec-1995      ASSESSMENT:              ICD-10-CM  ICD-9-CM             1.  Pelvic pain   R10.2  [id number]  AMB POC URINALYSIS DIP STICK AUTO W/O MICRO                AMB POC PVR, MEAS,POST-VOID RES,US,NON-IMAGING           REFERRAL TO PHYSICAL THERAPY           2.  Interstitial cystitis   N30.10  595.1  AMB POC URINALYSIS DIP STICK AUTO W/O MICRO                AMB POC PVR, MEAS,POST-VOID RES,US,NON-IMAGING           REFERRAL TO PHYSICAL THERAPY           3.  Pelvic floor dysfunction   M62.89  618.83  REFERRAL TO PHYSICAL THERAPY            -Pelvic pain, chronic x1 yr.  Very bothersome.  Fruitless GYN eval.  No obvious cause for pain on CT 07/2018 or recent CTU    CTU 05/07/19: Partially malrotated left kidney. Prominent extrarenal pelvis and mild caliectasis in the left kidney. No obstructive abnormality is evident.      No benefit with PFPT- SP discomfort prevents physical activity    UDS reviewed revealing: Per [facility] patient has a large capacity, compliant bladder with normal sensations and no DO. She voided with a prolonged continuous pressure detrusor contraction, low continuous flow, and unrelaxed EUS and low PVR.     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    Renal Scan 04/28/2018: Split function 50/50. T 1/2 3 minutes on the left.     CT A/P 07/2018: Pelviectasis and minimal caliectasis on the left suggests mild UPJ obstruction    CTU 05/07/19: No obstructive abnormality is evident.      Cysto 11/01/19 normal       PLAN:     Continue behavioral modifications including: timed voiding every 2-3 hours, limiting bladder irritants. Can use Prelief PRN.    Stop Atarax     Start vaginal valium suppositories nightly as needed - [facility]    Continue consistent HEP, discussed restarting PFPT, printed referral for [facility] provided if she has no improvement with valium.       RTC in 3 months, sooner if needed.    All questions answered.            Chief Complaint       Patient presents with        ?  Pelvic Pain             Pt returns to office for f/u take Hydroxione  PRN, and breathing excerices, no change in improvement         HISTORY OF PRESENT ILLNESS:   Kim [name] is a 24 y.o. Caucasian  female who presents today for follow up of pelvic pain. 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       Continues to have constant pain in her right lower pelvic area, sometimes will radiate across her lower pelvic area. Occ heating pad but does not help much with the pain. Drinks water. No benefit with Atarax use. Continues breathing exercises per PFPT.  Denies gross hematuria or flank pain. No f/c/n/v.        PRIOR HISTORY:    01/14/20: Since last visit she was prescribed Neurontin. States it has caused drowsiness so she is taking at night, no benefit. Has completed bladder diary but left it at home. The main bladder irritant she eliminated was spicy foods, has not seen a benefit  yet. She also has decreased caffeine. Only drinks water, occ Gatorade. After she graduated college is when pain started, reports decreased stress after graduating. Previously prescribed Elimiron which she did not start due to cost and potential SE. Continues  to have pain when she exercises, hesitant to start exercising due to pain it causes. Denies current gross hematuria or flank pain. No f/c/n/v.       11/11/19 per Dr. 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 Renal Scan 04/28/2018 demonstrated a good post-Lasix T1/2 time of 3 mins in the left kidney.      Since last visit she was sent for pelvic floor physical therapy to address pelvic pain.  She underwent cystoscopy with  Rigel Filsinger [place] which was negative for bladder pathology.  She had urodynamic study as well which showed a high postvoid residual.   Pt is still noting suprapubic pain that is aggravated by exercise. She feels the pain when her bladder is full and after emptying.  Denies flank pain.  No gross hematuria.  Reports good FOS and does not need to strain to void.  She has not cut out spicy  foods.  She has tried [place] and states it did not help.  She states pelvic floor physical therapy did not help.     11/01/19: Patient states that she continues to have bothersome lower pelvic pain, started 1.5 yrs ago. Reports pain after she urinates and randomly. Minimal improvement with heat. Has IUD, only has random menses and pain not noticeably worse at this  time. Drinks water. Asymptomatic for infection. Denies flank pain, gross hematuria, dysuria, or frequency. No f/c/n/v.       09/10/19 per Vickki Muff, PA: Presents in follow up for pelvic pain and left flank pain.    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 Renal Scan 04/28/2018 demonstrated a good post-Lasix T1/2 time of 3 mins in the left kidney.      She is currently being seen for worsening lower abdominal/pelvic pain and left flank pain for the last 18+ months. Now having more frequent episodes of pain that is affecting her daily activities. severity of pain varies.  She has had a negative evaluation  by GYN. Symptoms not improved with abx for fallopian tube "fullness." She also reports suprapubic/bladder pain after she finishes voiding that builds as her bladder gets fuller. Describes as stabbing. Her biggest issues is the SP pain she has that prevents  her sometimes even from walking. She had to sit down while [place] the other day due to SP discomfort. Denies dysuria. No gross hematuria. No urinary frequency, urgency or incontinence.        No benefit with PFPT at all. She has been performing the exercises as recommended. PT does not  feel symptoms are solely MSK in nature.      Review of Systems   Constitutional: Fever: No   Skin: Rash: No   HEENT: Hearing difficulty: No   Eyes: Blurred vision: No   Cardiovascular: Chest pain: No   Respiratory: Shortness of breath: No   Gastrointestinal: Nausea/vomiting: No   Musculoskeletal: Back pain: No   Neurological: Weakness: No   Psychological: Memory loss: No   Comments/additional findings:         Past Medical History:        Diagnosis  Date         ?  Bronchitis, chronic (HCC)       ?  Chronic kidney disease            congenital left hydronephrosis         ?  Hydronephrosis            congenital left side         ?  Irregular menses       ?  Pelvic pain           ?  Reactive airway disease            Past Surgical History:         Procedure  Laterality  Date          ?  HX ORTHOPAEDIC  Bilateral            knee surgery          ?  HX OTHER SURGICAL              "clean out" bilaterally          ?  HX PELVIC LAPAROSCOPY              ?  HX TONSIL AND ADENOIDECTOMY              Social History          Tobacco Use         ?  Smoking status:  Never Smoker     ?  Smokeless tobacco:  Never Used       Substance Use Topics         ?  Alcohol use:  No         ?  Drug use:  No        No Known Allergies        Family History         Problem  Relation  Age of Onset          ?  Cancer  Paternal Grandmother            ?  Cancer  Paternal Grandfather          PHYSICAL EXAMINATION:    Visit Vitals      Resp  12     Ht  5\' 10"  (1.778 m)     Wt  187 lb (84.8 kg)        BMI  26.83 kg/m??         GEN: NAD, alert and oriented   GU female 11/01/19: NEFG on cysto         REVIEW OF  LABS AND IMAGING:       Results for orders placed or performed in visit on 02/18/20     AMB POC URINALYSIS DIP STICK AUTO W/O MICRO         Result  Value  Ref Range            Color (UA POC)  Dark Yellow         Clarity (UA POC)  Slightly Cloudy         Glucose (UA POC)  Negative  Negative       Bilirubin (UA POC)  Negative  Negative       Ketones (UA  POC)  Negative  Negative       Specific gravity (UA POC)  1.025  1.001 - 1.035       Blood (UA POC)  1+  Negative       pH (UA POC)  5.5  4.6 - 8.0       Protein (UA POC)  Trace  Negative       Urobilinogen (UA POC)  0.2 mg/dL  0.2 - 1       Nitrites (UA POC)  Negative  Negative       Leukocyte esterase (UA POC)  Trace  Negative       AMB POC PVR, MEAS,POST-VOID RES,US,NON-IMAGING         Result  Value  Ref Range            PVR  0  cc        UDS 10/01/19   Voiding Diary: not completed        Uroflometry      Post Void Residual  75 ml clear yellow urine obtained        Comments: Pt voided <2 hours prior no desire to void.         Cystometrogram      Sensation  Normal        First sensation  3 ml        Strong desire  158 ml        Capacity  507 ml         Compliance: normal             Detrusor overactivity: no           Leakage: no        Micturition      Voided Volume:  216 ml         Detrusor Pressure:  At peak flow rate: 23 cm H2O  Maximum: 50 cm H2O         Duration Contraction:  >60sec continuous           Post Void Contraction:  no          Valsalva:  no        External Sphincter:  unrelaxed        Max Flow Rate:  15.1 ml/sec        Average Flow Rate  6.3 ml/sec        Voiding Time:  1:20 minutes        Flow Pattern:  continuous        Post Void Residual:  Obtained 66ml of urine with 20 cc syringe via Pves catheter, as I was emptying her bladder noted large amount of urine on chux pad underneath uroflow leading  me to believe she did empty well today.        Comments:  Patient was given Bactrim DS PO per UDS protocol and confirmed she is not on any urologic medications. Per [facility] patient has a large capacity, compliant  bladder with normal sensations and no DO. She voided with a prolonged continuous pressure detrusor contraction, low continuous flow, and unrelaxed EUS and low PVR.         CTU 05/07/19   Precontrast images reveal no urinary, biliary, or vascular calcifications.  CT ABDOMEN:  Lung bases:  Clear.  Liver: ??Unremarkable  Spleen: Unremarkable   Left Kidney: The kidney is partially malrotated with the collecting system anterior rather than anteromedial. There is a prominent extrarenal pelvis and mild caliectasis. No ureteral dilation is evident. No filling defects are evident. No renal pelvis  or ureteral stricture is apparent.. No UPJ obstruction is present  Right Kidney: Negative.  Pancreas: Negative.  Adrenal glands: Normal.  Stomach, Small Bowel and Colon: Unremarkable.  No lymphadenopathy evident.  The abdominal  aorta and cava are unremarkable  Peritoneal aces: There is no free fluid or free air.  The bladder is unremarkable  Uterus and adnexal structures are only partially cystlike hypodensity in the region of the cervix, probably nabothian. Otherwise  negative      IMPRESSION   Partially malrotated left kidney.   Prominent extrarenal pelvis and mild caliectasis in the left kidney. Unremarkable urinary tract otherwise.   No obstructive abnormality is evident.       CT Abd Pelv w Cont 07/30/18  No acute abnormalities. No enlarged parametrial vessels or gonadal veins to suggest pelvic congestion syndrome.   Pelviectasis and minimal caliectasis on the left suggests mild UPJ obstruction. Uncertain etiology. No definite crossing vessel although a CTA would be a more sensitive test to evaluate for arterial extrinsic compression at UPJ. While there does not appear  to be significant decrease in function on the left based on enhancement and size is normal, nuclear medicine renogram would be a more sensitive test to evaluate function of the kidneys.  Physiologic quantity free fluid in the pelvis.      Renal Scan 04/28/2018   IMPRESSION:   1. Delayed transit and excretion of the left kidney secondary to UPJ obstruction   2. Normal function of the right kidney.      Imaging Report Reviewed?  no   Type: n/a   Images Reviewed?     no     Type: N/A   ??   Other Lab Data Reviewed?   no      Eben Burow. [place],  NP-C   [facility]   a division of [facility] [place]      [address]   [place], [place] [address]   [phone]   [phone] fax         CC: Benay Spice, MD

## 2020-05-18 ENCOUNTER — Encounter

## 2020-05-18 ENCOUNTER — Inpatient Hospital Stay: Admit: 2020-05-18 | Payer: BLUE CROSS/BLUE SHIELD | Primary: Family Medicine

## 2020-05-18 DIAGNOSIS — G8929 Other chronic pain: Secondary | ICD-10-CM

## 2020-05-19 ENCOUNTER — Ambulatory Visit: Attending: Nurse Practitioner | Primary: Family Medicine

## 2020-05-19 ENCOUNTER — Ambulatory Visit: Admit: 2020-05-19 | Discharge: 2020-05-19 | Attending: Nurse Practitioner | Primary: Family Medicine

## 2020-05-19 DIAGNOSIS — R102 Pelvic and perineal pain: Secondary | ICD-10-CM

## 2020-05-19 LAB — AMB POC URINALYSIS DIP STICK AUTO W/O MICRO
Bilirubin (UA POC): NEGATIVE
Bilirubin, Urine, POC: NEGATIVE
Blood (UA POC): NEGATIVE
Blood (UA POC): NEGATIVE
Glucose (UA POC): NEGATIVE
Glucose, Urine, POC: NEGATIVE
Ketones (UA POC): NEGATIVE
Ketones, Urine, POC: NEGATIVE
Leukocyte Esterase, Urine, POC: NEGATIVE
Leukocyte esterase (UA POC): NEGATIVE
Nitrite, Urine, POC: NEGATIVE
Nitrites (UA POC): NEGATIVE
Protein (UA POC): NEGATIVE
Protein, Urine, POC: NEGATIVE
Specific Gravity, Urine, POC: 1.005 NA (ref 1.001–1.035)
Specific gravity (UA POC): 1.005 (ref 1.001–1.035)
Urobilinogen (UA POC): 0.2 (ref 0.2–1)
Urobilinogen, POC: 0.2 (ref 0.2–1)
pH (UA POC): 6 (ref 4.6–8.0)
pH, Urine, POC: 6 NA (ref 4.6–8.0)

## 2020-05-19 LAB — AMB POC PVR, MEAS,POST-VOID RES,US,NON-IMAGING
PVR POC: 0 cc
PVR: 0 cc

## 2020-05-19 MED ORDER — AMITRIPTYLINE 10 MG TAB
10 mg | ORAL_TABLET | Freq: Every evening | ORAL | 4 refills | Status: AC
Start: 2020-05-19 — End: ?

## 2020-05-19 NOTE — Progress Notes (Signed)
Progress Notes by [place], Chandra Feger F, NP at 05/19/20 1020                Author: [place], Farren Nelles F, NP  Service: --  Author Type: Nurse Practitioner       Filed: 05/19/20 1237  Encounter Date: 05/19/2020  Status: Signed          Editor: [place], Avonne Berkery F, NP (Nurse Practitioner)                       [name]   05/14/1995      ASSESSMENT:              ICD-10-CM  ICD-9-CM             1.  Pelvic pain   R10.2  IMO0002  AMB POC URINALYSIS DIP STICK AUTO W/O MICRO                AMB POC PVR, MEAS,POST-VOID RES,US,NON-IMAGING           2.  Interstitial cystitis   N30.10  595.1  AMB POC URINALYSIS DIP STICK AUTO W/O MICRO                AMB POC PVR, MEAS,POST-VOID RES,US,NON-IMAGING           3.  Pelvic floor dysfunction   M62.89  618.83              -Pelvic pain, chronic x1 yr.  Very bothersome.  Fruitless GYN eval.  No obvious cause for pain on CT 07/2018 or recent CTU    CTU 05/07/19: Partially malrotated left kidney. Prominent extrarenal pelvis and mild caliectasis in the left kidney. No obstructive abnormality is evident.      No benefit with PFPT- SP discomfort prevents physical activity    UDS reviewed revealing: Per CMG patient has a large capacity, compliant bladder with normal sensations and no DO. She voided with a prolonged continuous pressure detrusor contraction, low continuous flow, and unrelaxed EUS and low PVR.     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    Renal Scan 04/28/2018: Split function 50/50. T 1/2 3 minutes on the left.     CT A/P 07/2018: Pelviectasis and minimal caliectasis on the left suggests mild UPJ obstruction    CTU 05/07/19: No obstructive abnormality is evident.      Cysto 11/01/19 normal       PLAN:     Happy to send records to UroGyn once patient has provider's information.    Discussed IC pathway.    Start Elavil 10mg  every bedtime, r/b SE discussed, RX sent to pharmacy    Continue behavioral modifications including:  timed voiding every 2-3 hours, limiting bladder irritants. Can use Prelief PRN.       All questions answered.            Chief Complaint       Patient presents with        ?  Pelvic Pain             Pt presents today for an 3 month follow up for pelvic pain and IC. Patient was given a prescription for vaginal suppositories at her last office  visit and per patient vaginal medication was not effective with SEs of rash on inner thighs, due to excretion of medication. Pelvic pain continous and per patient she states  that she does have pain after voiding that increases. PVR reads 0 ml today.         HISTORY OF PRESENT ILLNESS:   Kim [name] is a 25 y.o. Caucasian  female who presents today for follow up of pelvic pain. 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       Continues to have bothersome lower pelvic pain, rates 6/10. Increases after voiding. Also having pain in her lower back, unsure if related. Has seen her PCP who has sent her for x-rays and plans to obtain MRI. Has taken Aleve for her back with no relief.  Occ will have some pelvic pain relief with heating pad. Denies increased stress. Denies gross hematuria or flank pain. No f/c/n/v.       Did not attend PFPT, plans to see a UroGyn. Does not have an appointment scheduled yet.       PRIOR HISTORY:    02/18/20: Continues to have constant pain in her right lower pelvic area, sometimes will radiate across her lower pelvic area. Occ heating pad but does not help much with the pain. Drinks water. No benefit with Atarax use. Continues breathing exercises  per PFPT. Denies gross hematuria or flank pain. No f/c/n/v.       01/14/20: Since last visit she was prescribed Neurontin. States it has caused drowsiness so she is taking at night, no benefit. Has completed bladder diary but left it at home. The main bladder irritant she eliminated was spicy foods, has not seen a benefit  yet. She also has decreased caffeine. Only drinks water, occ Gatorade. After she graduated college is when pain  started, reports decreased stress after graduating. Previously prescribed Elimiron which she did not start due to cost and potential SE. Continues  to have pain when she exercises, hesitant to start exercising due to pain it causes. Denies current gross hematuria or flank pain. No f/c/n/v.       11/11/19 per Dr. 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 Renal Scan 04/28/2018 demonstrated a good post-Lasix T1/2 time of 3 mins in the left kidney.      Since last visit she was sent for pelvic floor physical therapy to address pelvic pain.  She underwent cystoscopy with Imraan Wendell [place] which was negative for bladder pathology.  She had urodynamic study as well which showed a high postvoid residual.   Pt is still noting suprapubic pain that is aggravated by exercise. She feels the pain when her bladder is full and after emptying.  Denies flank pain.  No gross hematuria.  Reports good FOS and does not need to strain to void.  She has not cut out spicy  foods.  She has tried [place] and states it did not help.  She states pelvic floor physical therapy did not help.     11/01/19: Patient states that she continues to have bothersome lower pelvic pain, started 1.5 yrs ago. Reports pain after she urinates and randomly. Minimal improvement with heat. Has IUD, only has random menses and pain not noticeably worse at this  time. Drinks water. Asymptomatic for infection. Denies flank pain, gross hematuria, dysuria, or frequency. No f/c/n/v.       09/10/19 per Vickki Muff, PA: Presents in follow up for pelvic pain and left flank pain.    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 Renal Scan 04/28/2018 demonstrated a good post-Lasix T1/2 time of 3 mins in the left kidney.  She is currently being seen for worsening lower abdominal/pelvic pain and left flank pain for the last 18+ months. Now having more frequent episodes of pain that is affecting her daily activities. severity of pain  varies.  She has had a negative evaluation  by GYN. Symptoms not improved with abx for fallopian tube "fullness." She also reports suprapubic/bladder pain after she finishes voiding that builds as her bladder gets fuller. Describes as stabbing. Her biggest issues is the SP pain she has that prevents  her sometimes even from walking. She had to sit down while walking on the beach the other day due to SP discomfort. Denies dysuria. No gross hematuria. No urinary frequency, urgency or incontinence.        No benefit with PFPT at all. She has been performing the exercises as recommended. PT does not feel symptoms are solely MSK in nature.      Review of Systems   Constitutional: Fever: No   Skin: Rash: No   HEENT: Hearing difficulty: No   Eyes: Blurred vision: No   Cardiovascular: Chest pain: No   Respiratory: Shortness of breath: No   Gastrointestinal: Nausea/vomiting: No   Musculoskeletal: Back pain: Yes   lower back pain x 2 months   Neurological: Weakness: No   Psychological: Memory loss: No   Comments/additional findings:         Past Medical History:        Diagnosis  Date         ?  Bronchitis, chronic (HCC)       ?  Chronic kidney disease            congenital left hydronephrosis         ?  Hydronephrosis            congenital left side         ?  Irregular menses       ?  Pelvic pain           ?  Reactive airway disease            Past Surgical History:         Procedure  Laterality  Date          ?  HX ORTHOPAEDIC  Bilateral            knee surgery          ?  HX OTHER SURGICAL              "clean out" bilaterally          ?  HX PELVIC LAPAROSCOPY              ?  HX TONSIL AND ADENOIDECTOMY              Social History          Tobacco Use         ?  Smoking status:  Never Smoker     ?  Smokeless tobacco:  Never Used       Substance Use Topics         ?  Alcohol use:  No         ?  Drug use:  No          Allergies        Allergen  Reactions         ?  Other Medication  Other (comments)  Antibiotic-  possibly increased salivary gland             Family History         Problem  Relation  Age of Onset          ?  Cancer  Paternal Grandmother            ?  Cancer  Paternal Grandfather          PHYSICAL EXAMINATION:    Visit Vitals      Resp  16     Ht  5\' 11"  (1.803 m)     Wt  190 lb (86.2 kg)        BMI  26.50 kg/m??         GEN: NAD, alert and oriented   GU female 11/01/19: NEFG on cysto   GI: lower abdomen and suprapubic TTP, upper abdomen no pain on palpation.    No CVAT         REVIEW OF LABS AND IMAGING:       Results for orders placed or performed in visit on 05/19/20     AMB POC URINALYSIS DIP STICK AUTO W/O MICRO         Result  Value  Ref Range            Color (UA POC)  Yellow         Clarity (UA POC)  Clear         Glucose (UA POC)  Negative  Negative       Bilirubin (UA POC)  Negative  Negative       Ketones (UA POC)  Negative  Negative       Specific gravity (UA POC)  1.005  1.001 - 1.035       Blood (UA POC)  Negative  Negative       pH (UA POC)  6.0  4.6 - 8.0       Protein (UA POC)  Negative  Negative       Urobilinogen (UA POC)  0.2 mg/dL  0.2 - 1       Nitrites (UA POC)  Negative  Negative       Leukocyte esterase (UA POC)  Negative  Negative       AMB POC PVR, MEAS,POST-VOID RES,US,NON-IMAGING         Result  Value  Ref Range            PVR  0  cc        UDS 10/01/19   Voiding Diary: not completed        Uroflometry      Post Void Residual  75 ml clear yellow urine obtained        Comments: Pt voided <2 hours prior no desire to void.         Cystometrogram      Sensation  Normal        First sensation  3 ml        Strong desire  158 ml        Capacity  507 ml         Compliance: normal             Detrusor overactivity: no           Leakage: no        Micturition      Voided Volume:  216 ml         Detrusor Pressure:  At peak flow rate: 23 cm H2O  Maximum: 50 cm H2O         Duration Contraction:  >60sec continuous           Post Void Contraction:  no          Valsalva:  no        External  Sphincter:  unrelaxed        Max Flow Rate:  15.1 ml/sec        Average Flow Rate  6.3 ml/sec        Voiding Time:  1:20 minutes        Flow Pattern:  continuous        Post Void Residual:  Obtained 55ml of urine with 20 cc syringe via Pves catheter, as I was emptying her bladder noted large amount of urine on chux pad underneath uroflow leading  me to believe she did empty well today.        Comments: Patient was given Bactrim DS PO per UDS protocol and confirmed she is not on any urologic medications. Per CMG patient has a large capacity, compliant  bladder with normal sensations and no DO. She voided with a prolonged continuous pressure detrusor contraction, low continuous flow, and unrelaxed EUS and low PVR.         CTU 05/07/19   Precontrast images reveal no urinary, biliary, or vascular calcifications.  CT ABDOMEN:  Lung bases: Clear.  Liver: ??Unremarkable  Spleen: Unremarkable   Left Kidney: The kidney is partially malrotated with the collecting system anterior rather than anteromedial. There is a prominent extrarenal pelvis and mild caliectasis. No ureteral dilation is evident. No filling defects are evident. No renal pelvis  or ureteral stricture is apparent.. No UPJ obstruction is present  Right Kidney: Negative.  Pancreas: Negative.  Adrenal glands: Normal.  Stomach, Small Bowel and Colon: Unremarkable.  No lymphadenopathy evident.  The abdominal  aorta and cava are unremarkable  Peritoneal aces: There is no free fluid or free air.  The bladder is unremarkable  Uterus and adnexal structures are only partially cystlike hypodensity in the region of the cervix, probably nabothian. Otherwise  negative   IMPRESSION   Partially malrotated left kidney.   Prominent extrarenal pelvis and mild caliectasis in the left kidney. Unremarkable urinary tract otherwise.   No obstructive abnormality is evident.       CT Abd Pelv w Cont 07/30/18  No acute abnormalities. No enlarged parametrial vessels or gonadal veins to  suggest pelvic congestion syndrome.   Pelviectasis and minimal caliectasis on the left suggests mild UPJ obstruction. Uncertain etiology. No definite crossing vessel although a CTA would be a more sensitive test to evaluate for arterial extrinsic compression at UPJ. While there does not appear  to be significant decrease in function on the left based on enhancement and size is normal, nuclear medicine renogram would be a more sensitive test to evaluate function of the kidneys.  Physiologic quantity free fluid in the pelvis.      Renal Scan 04/28/2018   IMPRESSION:   1. Delayed transit and excretion of the left kidney secondary to UPJ obstruction   2. Normal function of the right kidney.      Imaging Report Reviewed?  no   Type: n/a   Images Reviewed?     no     Type: N/A   ??   Other Lab Data Reviewed?   no      Eben Burow. [place], NP-C  [facility]   a division of [facility] [place]      [address]   [place], [place] [address]   [phone]   [phone] fax         CC: Benay Spice, MD

## 2020-05-23 ENCOUNTER — Encounter

## 2020-06-14 ENCOUNTER — Inpatient Hospital Stay: Payer: BLUE CROSS/BLUE SHIELD | Attending: Family Medicine | Primary: Family Medicine

## 2020-06-14 ENCOUNTER — Inpatient Hospital Stay: Admit: 2020-06-14 | Payer: BLUE CROSS/BLUE SHIELD | Attending: Family Medicine | Primary: Family Medicine

## 2020-06-14 DIAGNOSIS — N83201 Unspecified ovarian cyst, right side: Secondary | ICD-10-CM

## 2020-06-14 MED ORDER — GADOBUTROL 1 MMOL/ML (604.72 MG/ML) IV
1 mmol/mL (604.72 mg/mL) | Freq: Once | INTRAVENOUS | Status: AC
Start: 2020-06-14 — End: 2020-06-14
  Administered 2020-06-14: 16:00:00 via INTRAVENOUS

## 2020-06-14 MED FILL — GADAVIST 1 MMOL/ML (604.72 MG/ML) INTRAVENOUS SOLUTION: 1 mmol/mL (604.72 mg/mL) | INTRAVENOUS | Qty: 9

## 2020-07-10 ENCOUNTER — Emergency Department: Admit: 2020-07-10 | Payer: BLUE CROSS/BLUE SHIELD | Primary: Family Medicine

## 2020-07-10 ENCOUNTER — Inpatient Hospital Stay
Admit: 2020-07-10 | Discharge: 2020-07-10 | Disposition: A | Discharge status: Home / Self Care | Payer: BLUE CROSS/BLUE SHIELD | Attending: Emergency Medicine

## 2020-07-10 DIAGNOSIS — E860 Dehydration: Secondary | ICD-10-CM

## 2020-07-10 LAB — POC URINE MICROSCOPIC

## 2020-07-10 LAB — METABOLIC PANEL, BASIC
Anion gap: 6 mmol/L (ref 5–15)
BUN: 9 mg/dl (ref 7–25)
CO2: 26 mEq/L (ref 21–32)
Calcium: 9.4 mg/dl (ref 8.5–10.1)
Chloride: 108 mEq/L — ABNORMAL HIGH (ref 98–107)
Creatinine: 0.8 mg/dl (ref 0.6–1.3)
GFR est AA: >60
GFR est non-AA: >60
Glucose: 95 mg/dl (ref 74–106)
Potassium: 3.8 mEq/L (ref 3.5–5.1)
Sodium: 139 mEq/L (ref 136–145)

## 2020-07-10 LAB — POC URINE MACROSCOPIC
Bilirubin, Urine: NEGATIVE
Bilirubin: NEGATIVE
Blood, Urine: NEGATIVE
Blood: NEGATIVE
Glucose, Ur: NEGATIVE mg/dl
Glucose: NEGATIVE mg/dl
Ketone: NEGATIVE mg/dl
Ketones, Urine: NEGATIVE mg/dl
Nitrite, Urine: NEGATIVE
Nitrites: NEGATIVE
Protein, UA: NEGATIVE mg/dl
Protein: NEGATIVE mg/dl
Specific Gravity, UA: 1.01 (ref 1.005–1.030)
Specific gravity: 1.01 (ref 1.005–1.030)
Urobilinogen, UA, POCT: 0.2 EU/dl (ref 0.0–1.0)
Urobilinogen: 0.2 EU/dl (ref 0.0–1.0)
pH (UA): 7 (ref 5–9)
pH, UA: 7 (ref 5–9)

## 2020-07-10 LAB — CBC WITH AUTOMATED DIFF
BASOPHILS: 0.4 % (ref 0–3)
EOSINOPHILS: 2.5 % (ref 0–5)
HCT: 41.1 % (ref 37.0–50.0)
HGB: 14.2 gm/dl (ref 13.0–17.2)
IMMATURE GRANULOCYTES: 0.3 % (ref 0.0–3.0)
LYMPHOCYTES: 22.8 % — ABNORMAL LOW (ref 28–48)
MCH: 30 pg (ref 25.4–34.6)
MCHC: 34.5 gm/dl (ref 30.0–36.0)
MCV: 86.9 fL (ref 80.0–98.0)
MONOCYTES: 5.7 % (ref 1–13)
MPV: 9.5 fL (ref 6.0–10.0)
NEUTROPHILS: 68.3 % — ABNORMAL HIGH (ref 34–64)
NRBC: 0 (ref 0–0)
PLATELET: 290 10*3/uL (ref 140–450)
RBC: 4.73 M/uL (ref 3.60–5.20)
RDW-SD: 41.6 (ref 36.4–46.3)
WBC: 7.3 10*3/uL (ref 4.0–11.0)

## 2020-07-10 LAB — POC HCG,URINE
HCG urine, QL: NEGATIVE
Pregnancy Test(Urn): NEGATIVE

## 2020-07-10 LAB — BASIC METABOLIC PANEL
Anion Gap: 6 mmol/L (ref 5–15)
BUN: 9 mg/dl (ref 7–25)
CO2: 26 mEq/L (ref 21–32)
Calcium: 9.4 mg/dl (ref 8.5–10.1)
Chloride: 108 mEq/L — ABNORMAL HIGH (ref 98–107)
Creatinine: 0.8 mg/dl (ref 0.6–1.3)
EGFR IF NonAfrican American: >60
GFR African American: >60
Glucose: 95 mg/dl (ref 74–106)
Potassium: 3.8 mEq/L (ref 3.5–5.1)
Sodium: 139 mEq/L (ref 136–145)

## 2020-07-10 LAB — CBC WITH AUTO DIFFERENTIAL
Basophils %: 0.4 % (ref 0–3)
Eosinophils %: 2.5 % (ref 0–5)
Hematocrit: 41.1 % (ref 37.0–50.0)
Hemoglobin: 14.2 gm/dl (ref 13.0–17.2)
Immature Granulocytes: 0.3 % (ref 0.0–3.0)
Lymphocytes %: 22.8 % — ABNORMAL LOW (ref 28–48)
MCH: 30 pg (ref 25.4–34.6)
MCHC: 34.5 gm/dl (ref 30.0–36.0)
MCV: 86.9 fL (ref 80.0–98.0)
MPV: 9.5 fL (ref 6.0–10.0)
Monocytes %: 5.7 % (ref 1–13)
Neutrophils %: 68.3 % — ABNORMAL HIGH (ref 34–64)
Nucleated RBCs: 0 (ref 0–0)
Platelets: 290 10*3/uL (ref 140–450)
RBC: 4.73 M/uL (ref 3.60–5.20)
RDW-SD: 41.6 (ref 36.4–46.3)
WBC: 7.3 10*3/uL (ref 4.0–11.0)

## 2020-07-10 MED ORDER — SODIUM CHLORIDE 0.9% BOLUS IV
0.9 % | Freq: Once | INTRAVENOUS | Status: AC
Start: 2020-07-10 — End: 2020-07-10
  Administered 2020-07-10: 16:00:00 via INTRAVENOUS

## 2020-07-10 MED ORDER — MECLIZINE 12.5 MG TAB
12.5 mg | ORAL | Status: AC
Start: 2020-07-10 — End: 2020-07-10
  Administered 2020-07-10: 14:00:00 via ORAL

## 2020-07-10 MED ORDER — KETOROLAC TROMETHAMINE 15 MG/ML INJECTION
15 mg/mL | INTRAMUSCULAR | Status: AC
Start: 2020-07-10 — End: 2020-07-10
  Administered 2020-07-10: 16:00:00 via INTRAVENOUS

## 2020-07-10 MED ORDER — SODIUM CHLORIDE 0.9% BOLUS IV
0.9 % | INTRAVENOUS | Status: AC
Start: 2020-07-10 — End: 2020-07-10
  Administered 2020-07-10: 14:00:00 via INTRAVENOUS

## 2020-07-10 MED ORDER — MECLIZINE 25 MG TAB
25 mg | ORAL_TABLET | Freq: Three times a day (TID) | ORAL | 0 refills | Status: AC | PRN
Start: 2020-07-10 — End: 2020-07-17

## 2020-07-10 MED ORDER — ONDANSETRON 4 MG TAB, RAPID DISSOLVE
4 mg | ORAL_TABLET | Freq: Three times a day (TID) | ORAL | 0 refills | Status: AC | PRN
Start: 2020-07-10 — End: 2020-07-17

## 2020-07-10 MED ORDER — ONDANSETRON (PF) 4 MG/2 ML INJECTION
4 mg/2 mL | Freq: Once | INTRAMUSCULAR | Status: AC
Start: 2020-07-10 — End: 2020-07-10
  Administered 2020-07-10: 14:00:00 via INTRAVENOUS

## 2020-07-10 MED FILL — ONDANSETRON (PF) 4 MG/2 ML INJECTION: 4 mg/2 mL | INTRAMUSCULAR | Qty: 2

## 2020-07-10 MED FILL — MECLIZINE 12.5 MG TAB: 12.5 mg | ORAL | Qty: 2

## 2020-07-10 MED FILL — KETOROLAC TROMETHAMINE 15 MG/ML INJECTION: 15 mg/mL | INTRAMUSCULAR | Qty: 1

## 2020-07-10 NOTE — ED Notes (Signed)
Assisted patient to bedside commode

## 2020-07-10 NOTE — ED Provider Notes (Signed)
[facility]  Emergency [facility] Treatment Report    Patient: Kim BorrowClaire [name] Age: 25 y.o. Sex: female    Date of Birth: 01/24/1996 Admit Date: 07/10/2020 PCP: Benay SpicePalmisano, David V, MD   MRN: [id number]  CSN: [id number]     Room: ER28/ER28 Time Dictated: 1:01 PM      Dragon medical dictation software was used for portions of this report.  Unintended transcription errors may occur.    Chief Complaint   Dizziness  History of Present Illness   25 y.o. female presents today for the above.  She states that she was sitting at work, always felt as though her head was spinning, her eyes were spinning.  She felt unsteady.  She feels generalized body aches, she feels fairly weak and lightheaded.  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  She has had no vomiting, no loose stools or diarrhea.  No chest pain, no shortness of breath.  She denies any lateralizing weakness or numbness.    Review of Systems   Review of Systems   Constitutional: Negative for chills, diaphoresis, fever and weight loss.   HENT: Negative for congestion and sore throat.    Respiratory: Negative for cough, sputum production, shortness of breath and wheezing.    Cardiovascular: Negative for chest pain, palpitations, orthopnea and leg swelling.   Gastrointestinal: Negative for abdominal pain, diarrhea, nausea and vomiting.   Genitourinary: Negative for dysuria, frequency and urgency.   Musculoskeletal: Negative for back pain and falls.   Skin: Negative for rash.   Neurological: Positive for dizziness. Negative for tremors, sensory change, speech change, focal weakness and headaches.   Endo/Heme/Allergies: Does not bruise/bleed easily.   Psychiatric/Behavioral: Negative for substance abuse.       Past Medical/Surgical History     Past Medical History:   Diagnosis Date   ??? Bronchitis, chronic (HCC)    ??? Chronic kidney disease     congenital left hydronephrosis   ??? Hydronephrosis     congenital left side    ??? Irregular menses    ??? Pelvic pain    ??? Reactive airway disease      Past Surgical History:   Procedure Laterality Date   ??? HX ORTHOPAEDIC Bilateral     knee surgery   ??? HX OTHER SURGICAL      "clean out" bilaterally   ??? HX PELVIC LAPAROSCOPY     ??? HX TONSIL AND ADENOIDECTOMY         Social History     Social History     Socioeconomic History   ??? Marital status: SINGLE   Tobacco Use   ??? Smoking status: Never Smoker   ??? Smokeless tobacco: Never Used   Substance and Sexual Activity   ??? Alcohol use: No   ??? Drug use: No       Family History     Family History   Problem Relation Age of Onset   ??? Cancer Paternal Grandmother    ??? Cancer Paternal Grandfather        Current Medications     Current Outpatient Medications   Medication Sig Dispense Refill   ??? amitriptyline (ELAVIL) 10 mg tablet Take 1 Tablet by mouth nightly. 30 Tablet 4   ??? levonorgestrel (MIRENA IU)      ??? OTHER,NON-FORMULARY, Please compound vaginal valium 10mg  suppository   Sig: insert one suppository vaginally nightly as needed for pelvic pain 30 Each 5   ??? albuterol (  PROVENTIL HFA, VENTOLIN HFA, PROAIR HFA) 90 mcg/actuation inhaler Take  by inhalation.         Allergies     Allergies   Allergen Reactions   ??? Other Medication Other (comments)     Antibiotic- possibly increased salivary gland       Physical Exam     Visit Vitals  BP 130/66   Pulse 83   Temp 98.8 ??F (37.1 ??C)   Ht 5\' 11"  (1.803 m)   Wt 86.2 kg (190 lb)   SpO2 99%   BMI 26.50 kg/m??     Physical Exam  Constitutional:       General: She is not in acute distress.     Appearance: She is not diaphoretic.   HENT:      Head: Normocephalic and atraumatic.   Eyes:      General:         Right eye: No discharge.         Left eye: No discharge.      Conjunctiva/sclera: Conjunctivae normal.      Pupils: Pupils are equal, round, and reactive to light.   Neck:      Trachea: No tracheal deviation.   Cardiovascular:      Rate and Rhythm: Normal rate.      Heart sounds: Normal heart sounds. No murmur  heard.  No friction rub. No gallop.    Pulmonary:      Effort: Pulmonary effort is normal. No respiratory distress.      Breath sounds: Normal breath sounds. No stridor. No wheezing or rales.   Abdominal:      General: Bowel sounds are normal. There is no distension.      Palpations: Abdomen is soft. There is no mass.      Tenderness: There is no abdominal tenderness. There is no guarding or rebound.   Musculoskeletal:         General: No deformity. Normal range of motion.      Cervical back: Normal range of motion and neck supple.   Skin:     General: Skin is warm and dry.      Coloration: Skin is not pale.      Findings: No erythema or rash.   Neurological:      Mental Status: She is alert.      Comments: 5/5 strength in the arms and legs bilaterally, when he have her stand, gets unsteady on her feet.  But finger-to-nose, heel-to-shin shows no acute pathology.   Psychiatric:         Mood and Affect: Affect normal.         Impression and Management Plan   25 year old female here today with dizziness, spontaneous onset but relatively reassuring examination just a bit unsteady when she stands.  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  Will tremendously hydrate, given abrupt onset and associated headache that she never had before we will send for noncontrast head CT.  If Noncon CT is negative, suspicion for this being subarachnoid or any other more malignant pathology/vascular pathology is very low.  Initial examinations and reassess.    Diagnostic Studies   Lab:   Recent Results (from the past 12 hour(s))   CBC WITH AUTOMATED DIFF    Collection Time: 07/10/20 10:11 AM   Result Value Ref Range    WBC 7.3 4.0 - 11.0 1000/mm3    RBC 4.73 3.60 - 5.20 M/uL  HGB 14.2 13.0 - 17.2 gm/dl    HCT 42.5 95.6 - 38.7 %    MCV 86.9 80.0 - 98.0 fL    MCH 30.0 25.4 - 34.6 pg    MCHC 34.5 30.0 - 36.0 gm/dl    PLATELET 564 332 - 951 1000/mm3    MPV 9.5 6.0 - 10.0 fL    RDW-SD 41.6  36.4 - 46.3      NRBC 0 0 - 0      IMMATURE GRANULOCYTES 0.3 0.0 - 3.0 %    NEUTROPHILS 68.3 (H) 34 - 64 %    LYMPHOCYTES 22.8 (L) 28 - 48 %    MONOCYTES 5.7 1 - 13 %    EOSINOPHILS 2.5 0 - 5 %    BASOPHILS 0.4 0 - 3 %   METABOLIC PANEL, BASIC    Collection Time: 07/10/20 10:11 AM   Result Value Ref Range    Sodium 139 136 - 145 mEq/L    Potassium 3.8 3.5 - 5.1 mEq/L    Chloride 108 (H) 98 - 107 mEq/L    CO2 26 21 - 32 mEq/L    Glucose 95 74 - 106 mg/dl    BUN 9 7 - 25 mg/dl    Creatinine 0.8 0.6 - 1.3 mg/dl    GFR est AA >88.4      GFR est non-AA >60      Calcium 9.4 8.5 - 10.1 mg/dl    Anion gap 6 5 - 15 mmol/L   POC URINE MACROSCOPIC    Collection Time: 07/10/20 12:07 PM   Result Value Ref Range    Glucose Negative NEGATIVE,Negative mg/dl    Bilirubin Negative NEGATIVE,Negative      Ketone Negative NEGATIVE,Negative mg/dl    Specific gravity 1.660 1.005 - 1.030      Blood Negative NEGATIVE,Negative      pH (UA) 7.0 5 - 9      Protein Negative NEGATIVE,Negative mg/dl    Urobilinogen 0.2 0.0 - 1.0 EU/dl    Nitrites Negative NEGATIVE,Negative      Leukocyte Esterase Trace (A) NEGATIVE,Negative      Color Yellow      Appearance Clear     POC URINE MICROSCOPIC    Collection Time: 07/10/20 12:07 PM   Result Value Ref Range    Epithelial cells, squamous OCCASIONAL /LPF    WBC OCCASIONAL /HPF   POC HCG,URINE    Collection Time: 07/10/20 12:07 PM   Result Value Ref Range    HCG urine, QL negative NEGATIVE,Negative,negative         Imaging:    CT HEAD WO CONT    Result Date: 07/10/2020  HISTORY: Dizziness, syncope and collapse Technique: Multislice axial CT was obtained through the head. Multiplanar images were reconstructed. All CT scans at this facility are performed using automated exposure control, adjustment of the mA and/or kV according to patient size or use of iterative reconstruction technique to reduce radiation dose to as low as reasonably achievable. FINDINGS: The sulci and ventricles have appropriate size and  configuration. No intracranial mass effect. No evidence of acute infarct, hemorrhage, or intracranial mass. No brain edema, hydrocephalus, or extra-axial fluid collection. The cerebellar tonsils have appropriate positions. The visualized paranasal sinuses are clear.     IMPRESSION: No acute intracranial process demonstrated.           Medical Decision Making/ED Course   Patient has had persistent dizziness while here in the ED but symptoms have gradually abated  with symptomatic therapy and IV fluids.  On multiple repeat examinations she has shown slow but steady improvement.  After a course of fluids, antiemetics, we had her stand up again, she is steady on her feet now, able to walk, feeling much better.  I do not feel that she requires hospitalization given her reassuring work-up and imaging, do feel that she is stable for outpatient management with advised primary care follow-up.  She, as well as mother at the bedside were given return precautions for any progressive weakness, numbness, fever, chills, or any other concerning issues.           Final Diagnosis     Encounter Diagnoses     ICD-10-CM ICD-9-CM   1. Dehydration  E86.0 276.51   2. Dizziness  R42 780.4       Disposition   Discharge to home    Wynelle Bourgeois, MD  July 10, 2020    My signature above authenticates this document and my orders, the final    diagnosis (es), discharge prescription (s), and instructions in the Epic    record.  If you have any questions please contact [phone].

## 2020-07-10 NOTE — ED Notes (Signed)
Went over discharge instructions with patient.  Patient given opportunity to ask questions.  Patient verbalized understanding with all instructions.

## 2020-07-10 NOTE — ED Notes (Signed)
Pt comes by ems from work for dizziness/faint feeling while sitting at desk  Ems vital   HR 100  bp 142/92  02 98%

## 2020-11-22 ENCOUNTER — Ambulatory Visit: Attending: Physician Assistant | Primary: Family Medicine

## 2020-11-22 ENCOUNTER — Ambulatory Visit: Primary: Family Medicine

## 2020-11-22 ENCOUNTER — Ambulatory Visit: Admit: 2020-11-22 | Discharge: 2020-11-22 | Primary: Family Medicine

## 2020-11-22 ENCOUNTER — Ambulatory Visit: Admit: 2020-11-22 | Discharge: 2020-11-22 | Attending: Physician Assistant | Primary: Family Medicine

## 2020-11-22 DIAGNOSIS — Q6211 Congenital occlusion of ureteropelvic junction: Secondary | ICD-10-CM

## 2020-11-22 DIAGNOSIS — N135 Crossing vessel and stricture of ureter without hydronephrosis: Secondary | ICD-10-CM

## 2020-11-22 LAB — AMB POC URINALYSIS DIP STICK AUTO W/O MICRO
Bilirubin (UA POC): NEGATIVE
Bilirubin, Urine, POC: NEGATIVE
Blood (UA POC): NEGATIVE
Blood (UA POC): NEGATIVE
Glucose (UA POC): NEGATIVE
Glucose, Urine, POC: NEGATIVE
Ketones (UA POC): NEGATIVE
Ketones, Urine, POC: NEGATIVE
Nitrite, Urine, POC: NEGATIVE
Nitrites (UA POC): NEGATIVE
Protein (UA POC): NEGATIVE
Protein, Urine, POC: NEGATIVE
Specific Gravity, Urine, POC: 1.01 NA (ref 1.001–1.035)
Specific gravity (UA POC): 1.01 (ref 1.001–1.035)
Urobilinogen (UA POC): 0.2 (ref 0.2–1)
Urobilinogen, POC: 0.2 (ref 0.2–1)
pH (UA POC): 7 (ref 4.6–8.0)
pH, Urine, POC: 7 NA (ref 4.6–8.0)

## 2020-11-22 NOTE — Progress Notes (Signed)
Leianna Barga has an order for LASIX RENAL SCAN     ALL FEMALE PATIENTS NEEDING AN MRI OF PELVIS OR PROSTATE, MUST BE SCHEDULED AT ONE OF THE FOLLOWING:    **MRI/CT (Preferred [facility])  **[facility] (Preferred [facility])  **[facility] (Preferred [place])  [facility]  [facility]      To be done at MRI/CT     Needed by:  1 YR     Patient has a follow-up appointment:  11/23/2021    If MRI, does patient have a pacemaker:   NO    Order has been placed in connect care:  YES    Is this a STAT order:  NO      Bianca A Betancourt

## 2020-11-22 NOTE — Progress Notes (Signed)
Renal ultrasound done per office protocol

## 2020-11-22 NOTE — Progress Notes (Signed)
Progress Notes by Vickki Muff, PA-C at 11/22/20 1400                Author: Vickki Muff, PA-C  Service: --  Author Type: Physician Assistant       Filed: 11/22/20 1441  Encounter Date: 11/22/2020  Status: Signed          Editor: Vickki Muff, PA-C (Physician Assistant)                       Kim [name]   Jun 27, 1995      ASSESSMENT:              ICD-10-CM  ICD-9-CM             1.  UPJ (ureteropelvic junction) obstruction   N13.5  593.4  AMB POC URINALYSIS DIP STICK AUTO W/O MICRO                NM RENAL SCAN FLOW/FUNC W PHARM SNGL                  2.  Pelvic pain   R10.2  IMO0002  AMB POC URINALYSIS DIP STICK AUTO W/O MICRO                   -Pelvic pain, chronic 2+ years.  Bothersome.  No obvious cause for pain on prior imaging including CT urogram.  Urodynamics suggests a degree of dysfunctional voiding pattern with a high PVR.  However she is not bothered by a sense of straining to void  or incomplete emptying.  Pelvic floor physical therapy did not seem to help symptoms. Tried/failed gabapentin, uribel, Atarax, valium suppositories, and PFPT. Did not try elavil due to concerns of possible side effects. Currently followed by UroGyn, undergoing  weekly methyl sulfoxide (Rimso-50) instillations. She denies any change in symptoms thus far. PVR 36 cc on RUS today 11/22/2020      -S/p left pyeloplasty 02/2015 for L UPJO.  Resolved.  RUS 11/22/2020: bilateral pelvocaliectasis, no hydroureter.       PLAN:     Continue follow-up with urogyn for IC/chronic pelvis pain   Follow-up 1 year with lasix renal scan prior, sooner as needed      I am following the established plan for Dr. Judie Petit and Dr. Daphine Deutscher is the supervising physician for this day.           Chief Complaint       Patient presents with        ?  Other             Pelvic pain           HISTORY OF PRESENT ILLNESS:   Kim [name] is a 25 y.o. Caucasian  female with a history of left UPJO, s/p left pyeloplasty 02/2015. Renal Scan 04/28/2018 demonstrated a  good post-Lasix T1/2 time of 3 mins in the left kidney. Also with history of chronic pelvic pain.      Patient is now followed by Dr. Norlene Campbell for chronic pelvic pain. She is currently undergoing bladder instillations weekly but denies any change thus far. She did see ortho for a different lumbar spine pain that improved somewhat with injection. She notes  occasional left flank discomfort, nothing persistent.       Review of Systems   Constitutional: Fever: No   Skin: Rash: No   HEENT: Hearing difficulty: No   Eyes: Blurred vision: No   Cardiovascular: Chest pain: No  Respiratory: Shortness of breath: No   Gastrointestinal: Nausea/vomiting: No   Musculoskeletal: Back pain: No   Neurological: Weakness: No   Psychological: Memory loss: No   Comments/additional findings:         Past Medical History:        Diagnosis  Date         ?  Bronchitis, chronic (HCC)       ?  Chronic kidney disease            congenital left hydronephrosis         ?  Hydronephrosis            congenital left side         ?  Irregular menses       ?  Pelvic pain           ?  Reactive airway disease            Past Surgical History:         Procedure  Laterality  Date          ?  HX ORTHOPAEDIC  Bilateral            knee surgery          ?  HX OTHER SURGICAL              "clean out" bilaterally          ?  HX PELVIC LAPAROSCOPY              ?  HX TONSIL AND ADENOIDECTOMY              Social History          Tobacco Use         ?  Smoking status:  Never     ?  Smokeless tobacco:  Never       Substance Use Topics         ?  Alcohol use:  No         ?  Drug use:  No          Allergies        Allergen  Reactions         ?  Other Medication  Other (comments)             Antibiotic- possibly increased salivary gland             Family History         Problem  Relation  Age of Onset          ?  Cancer  Paternal Grandmother            ?  Cancer  Paternal Grandfather          PHYSICAL EXAMINATION:    Visit Vitals      Wt  194 lb (88 kg)        BMI  27.06  kg/m??            GEN: NAD, alert and oriented   PULM: nl resp effort, no distress   PSYCH: Nl mood and affect         REVIEW OF LABS AND IMAGING:       Results for orders placed or performed in visit on 11/22/20     AMB POC URINALYSIS DIP STICK AUTO W/O MICRO         Result  Value  Ref Range  Color (UA POC)           Clarity (UA POC)           Glucose (UA POC)  Negative  Negative       Bilirubin (UA POC)  Negative  Negative       Ketones (UA POC)  Negative  Negative       Specific gravity (UA POC)  1.010  1.001 - 1.035       Blood (UA POC)  Negative  Negative       pH (UA POC)  7.0  4.6 - 8.0       Protein (UA POC)  Negative  Negative       Urobilinogen (UA POC)  0.2 mg/dL  0.2 - 1       Nitrites (UA POC)  Negative  Negative            Leukocyte esterase (UA POC)  1+  Negative        Renal US 11/22/2020   The RIGHT kidney measures 11.14 x 5.67 x 5.22cm. There was mild pelvocaliectasis noted without hydroureter. No change with post void. Normal echotexture and blood flow by color Doppler noted throughout.      The LEFT kidney measures 11.26 x 5.92 x 5.57cm. There was pelvocaliectasis noted without hydroureter. 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 Normal echotexture and blood flow by color Doppler noted throughout.     The bladder was distended at 391 cc's. The  right urine jet was noted. The left urine jet was not seen in 5 minute period. The post void bladder measured 36 cc's. Incomplete bladder empty.     Impression:   Right mild pelvocaliectasis, no hydroureter. No change with post void.    Left pelvocaliectasis, no hydroureter. No change with post void.   Left malrotated kidney.   Distended bladder, 391 cc's. Right urine jet noted. Left urine jet not seen.   Post void bladder = 36 cc's. Incomplete bladder empty.      MRI Pelvis 06/14/2020   UTERUS: The uterus measures 5.3 x 4.6 x 4.4 cm. There is borderline junctional   zone thickening measuring up to 14 mm. There is IUD within the uterus. There is   soft tissue  fullness with increased T2 signal involving the cervix measuring up   to 19 mm. Mild enhancement is noted. Otherwise, no focal uterine mass lesion.       OVARIES/ADNEXA: Multiple bilateral ovarian follicles and cysts. Largest measures   2.1 cm in the left ovary. Follicles have peripheral orientation, raising   possibility for polycystic ovarian syndrome. No other adnexal mass lesions.       OTHER DISCUSSION: Moderate free fluid in the pelvic cul-de-sac. Urinary bladder   is grossly unremarkable.       IMPRESSION:   1.  IUD within the uterus   2.  Borderline junctional zone thickening of the uterus, raising possibility for   adenomyosis.   3.  Soft tissue fullness and enhancement involving the cervix. This may reflect   complex nabothian cyst. Inflammatory/neoplastic process of the cervix is not   excluded. Further evaluation is recommended.   4.  Multiple peripheral follicles and cysts within the bilateral ovaries.   Findings raise possibility for polycystic ovarian syndrome. Largest cyst   measures 2.1 cm and the left ovary.   5.  Moderate free fluid in the pelvic cul-de-sac.      CTU 05/07/19   Precontrast images reveal no urinary, biliary, or vascular  calcifications.  CT ABDOMEN:  Lung bases: Clear.  Liver:  Unremarkable  Spleen: Unremarkable  Left Kidney: The kidney  is partially malrotated with the collecting system anterior rather than anteromedial. There is a prominent extrarenal pelvis and mild caliectasis. No ureteral dilation is evident. No filling defects are evident. No renal pelvis or ureteral stricture is  apparent.. No UPJ obstruction is present  Right Kidney: Negative.  Pancreas: Negative.  Adrenal glands: Normal.  Stomach, Small Bowel and Colon: Unremarkable.  No lymphadenopathy evident.  The abdominal aorta and cava are unremarkable   Peritoneal aces: There is no free fluid or free air.  !!!!!er:The bladder is unremarkable  Uterus and adnexal structures are only partially cystlike hypodensity in the  region of the cervix, probably nabothian. Otherwise negative      IMPRESSION   Partially malrotated left kidney.   Prominent extrarenal pelvis and mild caliectasis in the left kidney. Unremarkable urinary tract otherwise.   No obstructive abnormality is evident.       Renal Scan 04/28/2018   IMPRESSION:   1. Delayed transit and excretion of the left kidney secondary to UPJ obstruction   2. Normal function of the right kidney.         Imaging Report Reviewed?  YES- RUS and MRI pelvis   Images Reviewed?     no           Other Lab/Data Reviewed?          Vickki MuffBrooke Braxston Quinter PA-C   [facility]       CC: Kris HartmannPalmisano, Oran Reinavid V, MD    CC: Dr. Norlene CampbellAlnaif

## 2021-01-09 ENCOUNTER — Encounter

## 2021-01-12 ENCOUNTER — Encounter

## 2021-01-12 ENCOUNTER — Inpatient Hospital Stay: Admit: 2021-01-12 | Payer: BLUE CROSS/BLUE SHIELD | Attending: Family Medicine | Primary: Family Medicine

## 2021-01-12 DIAGNOSIS — R1031 Right lower quadrant pain: Secondary | ICD-10-CM

## 2021-05-16 ENCOUNTER — Encounter

## 2021-05-17 ENCOUNTER — Encounter

## 2021-09-14 NOTE — Other (Signed)
[facility]  PREOPERATIVE INSTRUCTIONS    How to prepare  Nothing to eat or drink after midnight the night before surgery, unless otherwise specified.  This includes gum and mints.  You may brush your teeth in the morning, however do not swallow any water.  Please shower or bathe on the day of surgery to minimize the risk of infection.  Stop taking aspirin and aspirin products including Motrin, Advil, Ibuprofen, Aleve, Excedrin, BC Powder, fish oil, vitamins and herbals 2 weeks prior to surgery or as directed by your physician. Tylenol/Acetaminophen are okay.  Do not take any medication the morning of your procedure without your physician's approval.  Please speak with the Pre-Admission testing nurse at [phone] regarding specific instructions about your medications.  Please remove all jewelry and body piercings and leave all valuables at home.  Please remove, make-up, lotions, false eye lashes, nail polish, and contact lenses. If you have artificial nails, please remove from one from each hand.   You MUST make arrangements for a responsible adult to take you home after your surgery, analgesia or sedation.  It is strongly suggested that a responsible adult stay with you during the first 24 hours.  Please follow your surgeon's instructions regarding the time you need to arrive at [facility].  If you are late to the [facility] Center due to any unforeseen circumstance or your are ill the morning of surgery and need to cancel or be delayed, please call [facility] at [phone] option #5 or [phone].  If you need to cancel your surgery prior to the day of surgery, call your surgeon.  What to bring with you  Paperwork from your doctor's office that you have been given.  Insurance cards, a picture ID and a method to pay your insurance co-pay.  Wear comfortable, loose fitting clothing that will be easy for you to put back on after surgery.  If you have asthma and use inhalers, please  bring them with you.    Your stay at [facility]  [facility] is committed to excellence in the delivery of our health care services.  If you have any questions or concerns about the care you received please contact the Patient Representative at [phone] or the manager at [phone].    I have reviewed the above instructions:       Patient: Kim [name]  Date:     September 14, 2021 Time:   1:48 PM       RN: Georgana Curio, RN  Date:     September 14, 2021 Time:   1:48 PM

## 2021-09-25 NOTE — H&P (Signed)
Gynecology H&P    Name: Kim [name] MRN: [id number] SSN: [id number]    Date of Birth: 04-27-1995  Age: 26 y.o.  Sex: female       Subjective:      Chief complaint:  Chronic pelvic pain and Dysmenorrhea    Kim [name] is a 26 y.o. G0 Caucasian female with a history of chronic pelvic pain and dysmenorrhea.  In 2017 she had a pelvic CT for evaluation of pain and was noted to have both an ovarian cyst and ureteral blockage. She was referred to Urology and underwent stenting of the ureter, which resolved the problem.  Her pelvic pain continued after resolution of the ureteral problem and with the addition of heavy periods and a possible other cyst, she was started on OCP in 2019. She recalls then developing almost daily bleeding, which persisted until an IUD was inserted. Her bleeding concerns then stopped but she developed what she describes as severe pelvic pain,'  Her urologist referred her to Dr. Abe People who reportedly did hysteroscopy which showed polyps and laparoscopy which showed no endometriosis but did show her tube(s) to be inflamed. She was treated with antibiotics but the tube was still inflamed on a f/u US.  WHen her IUD was removed, it may have been difficult, suggesting that it was embedded, but its removal did not bring pain relief..  In 04/2020 she was started on [facility] POP with no significant improvement in her right and left lower abdominal pain. Her bleeding is not consistent but not excessively heavy either.    She was initially prescribed Lo Loestrin on 06/22/21. Due to lack of insurance coverage for this brand she is now on Hailey 24 1/20, which she says has helped neither the bleeding nor her pain.   Her last period started 07/10/21 and lasted 11 days. She is now on the 2nd pack of pills.  She also says the pain is no different with the prescribed meds and she wishes to proceed with repeat laparoscopy. Work-up to this point has included [place] 05/22/21 showed uterus appeared wnl; small amount of fluid seen in  endometrial canal, probable polyp also seen; cervix demonstrated moderate amount of fluid w/in canal; left ovary demonstrated multiple cysts, laregest measured; moderate amount of free fluid in posterior cds.   Patient's last menstrual period was 09/10/2021.Marland Kitchen      The current method of family planning is none.    Past Medical History:   Diagnosis Date    Bronchitis, chronic (HCC)     Chronic kidney disease     congenital left hydronephrosis    Hydronephrosis     congenital left side    Irregular menses     Pelvic pain     Reactive airway disease      Past Surgical History:   Procedure Laterality Date    ORTHOPEDIC SURGERY Bilateral     knee surgery    OTHER SURGICAL HISTORY Bilateral     "clean out" bilaterally  knees    OTHER SURGICAL HISTORY Right     Elbow surgery    PELVIC LAPAROSCOPY      TONSILLECTOMY AND ADENOIDECTOMY       OB History    No obstetric history on file.       No Known Allergies  No current facility-administered medications for this encounter.     Current Outpatient Medications   Medication Sig    norethindrone-ethinyl estradiol (JUNEL FE 1/20) 1-20 MG-MCG per tablet Take 1 tablet by mouth daily  albuterol sulfate HFA (PROVENTIL;VENTOLIN;PROAIR) 108 (90 Base) MCG/ACT inhaler Inhale 2 puffs into the lungs every 4 hours as needed for Wheezing       Family History   Problem Relation Age of Onset    Cancer Paternal Grandmother     Cancer Paternal Grandfather      Social History     Socioeconomic History    Marital status: Single     Spouse name: Not on file    Number of children: Not on file    Years of education: Not on file    Highest education level: Not on file   Occupational History    Not on file   Tobacco Use    Smoking status: Never    Smokeless tobacco: Never   Vaping Use    Vaping Use: Never used   Substance and Sexual Activity    Alcohol use: No    Drug use: No    Sexual activity: Not on file   Other Topics Concern    Not on file   Social History Narrative    Not on file     Social  Determinants of Health     Financial Resource Strain: Not on file   Food Insecurity: Not on file   Transportation Needs: Not on file   Physical Activity: Not on file   Stress: Not on file   Social Connections: Not on file   Intimate Partner Violence: Not on file   Housing Stability: Not on file       Review of Systems:  Constitutional: No weight change, chills or fever, anorexia, weakness or sleep disturbance . Cardiovascular: No chest pain, shortness of breath, or palpitations .  Respiratory: No cough, shortness of breath, hemoptysis, or orthopnea . Neurologic: No syncope, headaches or seizures . Hematologic: No easy bruising or unusual bleeding . Psychiatric: No insomnia, confusion, depression, or anxiety . GI:No nausea and vomiting, diarrhea or constipation  . GU: See HPI . Musculoskeletal: No joint pain or muscle pain . Endocrine: No polydipsia, polyuria, cold intolerance, excessive fatigue, or sleep disturbance . Integumentary: No breast pain, lumps, nipple discharge, or axillary lumps .      Objective:     Vitals:    09/14/21 1340   Weight: 200 lb (90.7 kg)   Height: 5\' 11"  (1.803 m)       General:  alert, cooperative, no distress, appears stated age   Skin:  Normal.    Lungs:  clear to auscultation bilaterally   Heart:  regular rate and rhythm, S1, S2 normal, no murmur, click, rub or gallop   Abdomen: soft, non-tender. Bowel sounds normal. No masses,  no organomegaly   Genitourinary: Exam deferred.   Extremities:  extremities normal, atraumatic, no cyanosis or edema     Assessment:     Abnormal uterine bleeding, Chronic pelvic pain, and Dysmenorrhea   Plan:   Laparoscopy , hysteroscopy with D&C  Discussed the risks of surgery including the risks of bleeding, infection, deep vein thrombosis, and surgical injuries to internal organs including but not limited to the bowels, bladder, rectum, and female reproductive organs. The patient understands the risks; any and all questions were answered to the patient's  satisfaction.    Signed By:  Reizy Dunlow, MD     September 25, 2021

## 2021-09-25 NOTE — Anesthesia Pre-Procedure Evaluation (Signed)
Department of Anesthesiology  Preprocedure Note       Name:  Kim [name]   Age:  26 y.o.  DOB:  [birth year]                                          MRN:  [id number]         Date:  09/25/2021      Surgeon: Moishe Spice):  Collier Flowers, MD    Procedure: Procedure(s):  OPERATIVE LAPAROSCOPY DILATATION AND CURETTAGE HYSTEROSCOPY    Medications prior to admission:   Prior to Admission medications    Medication Sig Start Date End Date Taking? Authorizing Provider   norethindrone-ethinyl estradiol (JUNEL FE 1/20) 1-20 MG-MCG per tablet Take 1 tablet by mouth daily   Yes Historical Provider, MD   albuterol sulfate HFA (PROVENTIL;VENTOLIN;PROAIR) 108 (90 Base) MCG/ACT inhaler Inhale 2 puffs into the lungs every 4 hours as needed for Wheezing    Ar Automatic Reconciliation       Current medications:    No current facility-administered medications for this encounter.     Current Outpatient Medications   Medication Sig Dispense Refill   . norethindrone-ethinyl estradiol (JUNEL FE 1/20) 1-20 MG-MCG per tablet Take 1 tablet by mouth daily     . albuterol sulfate HFA (PROVENTIL;VENTOLIN;PROAIR) 108 (90 Base) MCG/ACT inhaler Inhale 2 puffs into the lungs every 4 hours as needed for Wheezing         Allergies:  No Known Allergies    Problem List:    Patient Active Problem List   Diagnosis Code   . Hydronephrosis N13.30   . Pelvic pain R10.2   . 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 Chronic kidney disease N18.9   . 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 Cyst of ovary N83.209   . UPJ (ureteropelvic junction) obstruction N13.5   . 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 Bronchitis, chronic (HCC)    . Chronic kidney disease     congenital left hydronephrosis   . Hydronephrosis     congenital left side   . Irregular menses    . Pelvic pain    . Reactive airway disease        Past Surgical History:         Procedure Laterality Date   . ORTHOPEDIC SURGERY Bilateral     knee surgery   . OTHER SURGICAL HISTORY Bilateral     "clean out" bilaterally  knees   . OTHER SURGICAL HISTORY Right     Elbow surgery   . PELVIC LAPAROSCOPY     . TONSILLECTOMY AND ADENOIDECTOMY         Social History:    Social History     Tobacco Use   . Smoking status: Never   . Smokeless tobacco: Never   Substance Use Topics   . Alcohol use: No                                Counseling given: Not Answered      Vital Signs (Current):   Vitals:    09/14/21 1340   Weight: 200  lb (90.7 kg)   Height: 5\' 11"  (1.803 m)                                              BP Readings from Last 3 Encounters:   No data found for BP       NPO Status:                                                                                 BMI:   Wt Readings from Last 3 Encounters:   11/22/20 194 lb (88 kg)   05/19/20 190 lb (86.2 kg)   02/18/20 187 lb (84.8 kg)     Body mass index is 27.89 kg/m.    CBC:   Lab Results   Component Value Date/Time    WBC 7.3 07/10/2020 10:11 AM    RBC 4.73 07/10/2020 10:11 AM    HGB 14.2 07/10/2020 10:11 AM    HCT 41.1 07/10/2020 10:11 AM    MCV 86.9 07/10/2020 10:11 AM    PLT 290 07/10/2020 10:11 AM       CMP:   Lab Results   Component Value Date/Time    NA 139 07/10/2020 10:11 AM    K 3.8 07/10/2020 10:11 AM    CL 108 07/10/2020 10:11 AM    CO2 26 07/10/2020 10:11 AM    BUN 9 07/10/2020 10:11 AM    CREATININE 0.8 07/10/2020 10:11 AM    GFRAA >60.0 07/10/2020 10:11 AM    GLUCOSE 95 07/10/2020 10:11 AM    PROT 7.4 01/11/2018 05:40 PM    CALCIUM 9.4 07/10/2020 10:11 AM    BILITOT 0.4 01/11/2018 05:40 PM    ALKPHOS 55 01/11/2018 05:40 PM    AST 10 01/11/2018 05:40 PM    ALT 19 01/11/2018 05:40 PM       POC Tests: No results for input(s): POCGLU, POCNA, POCK, POCCL, POCBUN, POCHEMO, POCHCT in the last 72 hours.    Coags: No results found for: PROTIME, INR, APTT    HCG (If Applicable): No results found for: PREGTESTUR, PREGSERUM, HCG, HCGQUANT      ABGs: No results found for: PHART, PO2ART, PCO2ART, HCO3ART, BEART, O2SATART     Type & Screen (If Applicable):  No results found for: LABABO, LABRH    Drug/Infectious Status (If Applicable):  Lab Results   Component Value Date/Time    HEPCAB NON-REACTIVE 03/15/2019 04:10 PM       COVID-19 Screening (If Applicable): No results found for: COVID19        Anesthesia Evaluation  Patient summary reviewed and Nursing notes reviewed  Airway: Mallampati: II  TM distance: >3 FB   Neck ROM: full     Dental: normal exam         Pulmonary:Negative Pulmonary ROS and normal exam                               Cardiovascular:  Exercise tolerance: good (>4 METS),  Rhythm: regular  Rate: normal                    Neuro/Psych:   Negative Neuro/Psych ROS              GI/Hepatic/Renal: Neg GI/Hepatic/Renal ROS            Endo/Other: Negative Endo/Other ROS                    Abdominal:             Vascular: negative vascular ROS.         Other Findings:           Anesthesia Plan      general     ASA 2       Induction: intravenous.    MIPS: Prophylactic antiemetics administered.  Anesthetic plan and risks discussed with patient.      Plan discussed with CRNA.    Attending anesthesiologist reviewed and agrees with Preprocedure content                Vicente MalesHARLES Lorely Bubb, MD   09/25/2021

## 2021-09-26 ENCOUNTER — Inpatient Hospital Stay: Admit: 2021-09-27 | Payer: BLUE CROSS/BLUE SHIELD | Primary: Family Medicine

## 2021-09-26 ENCOUNTER — Inpatient Hospital Stay: Payer: BLUE CROSS/BLUE SHIELD | Attending: Obstetrics & Gynecology

## 2021-09-26 DIAGNOSIS — R102 Pelvic and perineal pain: Secondary | ICD-10-CM

## 2021-09-26 LAB — POC PREGNANCY UR-QUAL
HCG, Urine, POC: NEGATIVE
Lot Number: 607039

## 2021-09-26 MED ORDER — OXYCODONE-ACETAMINOPHEN 5-325 MG PO TABS
5-325 MG | ORAL_TABLET | Freq: Four times a day (QID) | ORAL | 0 refills | Status: AC | PRN
Start: 2021-09-26 — End: 2021-09-29

## 2021-09-26 MED ORDER — ACETAMINOPHEN 325 MG PO TABS
325 MG | Freq: Once | ORAL | Status: DC | PRN
Start: 2021-09-26 — End: 2021-09-26

## 2021-09-26 MED ORDER — OXYCODONE HCL 5 MG PO TABS
5 MG | ORAL | Status: DC | PRN
Start: 2021-09-26 — End: 2021-09-26

## 2021-09-26 MED ORDER — BUPIVACAINE-EPINEPHRINE 0.5% -1:200000 IJ SOLN
INTRAMUSCULAR | Status: DC | PRN
Start: 2021-09-26 — End: 2021-09-26
  Administered 2021-09-26: 17:00:00 9 via INTRADERMAL

## 2021-09-26 MED ORDER — HYDRALAZINE HCL 20 MG/ML IJ SOLN
20 MG/ML | INTRAMUSCULAR | Status: DC | PRN
Start: 2021-09-26 — End: 2021-09-26

## 2021-09-26 MED ORDER — OXYCODONE-ACETAMINOPHEN 5-325 MG PO TABS
5-325 MG | Freq: Once | ORAL | Status: AC
Start: 2021-09-26 — End: 2021-09-26
  Administered 2021-09-26: 20:00:00 1 via ORAL

## 2021-09-26 MED ORDER — ROCURONIUM BROMIDE 50 MG/5ML IV SOLN
50 MG/5ML | INTRAVENOUS | Status: DC | PRN
Start: 2021-09-26 — End: 2021-09-26
  Administered 2021-09-26: 17:00:00 5 via INTRAVENOUS

## 2021-09-26 MED ORDER — SUCCINYLCHOLINE CHLORIDE 20 MG/ML IJ SOLN
20 MG/ML | INTRAMUSCULAR | Status: DC | PRN
Start: 2021-09-26 — End: 2021-09-26
  Administered 2021-09-26: 17:00:00 100 via INTRAVENOUS

## 2021-09-26 MED ORDER — LABETALOL HCL 5 MG/ML IV SOLN
5 MG/ML | INTRAVENOUS | Status: DC | PRN
Start: 2021-09-26 — End: 2021-09-26

## 2021-09-26 MED ORDER — ONDANSETRON HCL 4 MG/2ML IJ SOLN
4 MG/2ML | Freq: Once | INTRAMUSCULAR | Status: DC | PRN
Start: 2021-09-26 — End: 2021-09-26

## 2021-09-26 MED ORDER — PROPOFOL 200 MG/20ML IV EMUL
200 MG/20ML | INTRAVENOUS | Status: DC | PRN
Start: 2021-09-26 — End: 2021-09-26
  Administered 2021-09-26: 17:00:00 200 via INTRAVENOUS

## 2021-09-26 MED ORDER — DEXAMETHASONE SODIUM PHOSPHATE 4 MG/ML IJ SOLN
4 MG/ML | INTRAMUSCULAR | Status: DC | PRN
Start: 2021-09-26 — End: 2021-09-26
  Administered 2021-09-26: 17:00:00 8 via INTRAVENOUS

## 2021-09-26 MED ORDER — GLYCOPYRROLATE 0.2 MG/ML IJ SOLN
0.2 MG/ML | INTRAMUSCULAR | Status: DC | PRN
Start: 2021-09-26 — End: 2021-09-26
  Administered 2021-09-26: 17:00:00 .2 via INTRAVENOUS

## 2021-09-26 MED ORDER — LIDOCAINE HCL 1 % IJ SOLN
1 % | INTRAMUSCULAR | Status: DC | PRN
Start: 2021-09-26 — End: 2021-09-26
  Administered 2021-09-26: 17:00:00 50 via INTRAVENOUS

## 2021-09-26 MED ORDER — LACTATED RINGERS IR SOLN
Status: DC | PRN
Start: 2021-09-26 — End: 2021-09-26
  Administered 2021-09-26: 18:00:00 400

## 2021-09-26 MED ORDER — LACTATED RINGERS IV SOLN
INTRAVENOUS | Status: DC
Start: 2021-09-26 — End: 2021-09-26
  Administered 2021-09-26: 16:00:00 via INTRAVENOUS

## 2021-09-26 MED ORDER — LACTATED RINGERS IV SOLN
INTRAVENOUS | Status: DC
Start: 2021-09-26 — End: 2021-09-26

## 2021-09-26 MED ORDER — FENTANYL CITRATE (PF) 100 MCG/2ML IJ SOLN
100 MCG/2ML | Freq: Once | INTRAMUSCULAR | Status: DC | PRN
Start: 2021-09-26 — End: 2021-09-26

## 2021-09-26 MED ORDER — DROPERIDOL 2.5 MG/ML IJ SOLN
2.5 MG/ML | Freq: Once | INTRAMUSCULAR | Status: DC | PRN
Start: 2021-09-26 — End: 2021-09-26

## 2021-09-26 MED ORDER — LIDOCAINE HCL (PF) 1 % IJ SOLN
1 % | Freq: Once | INTRAMUSCULAR | Status: DC | PRN
Start: 2021-09-26 — End: 2021-09-26

## 2021-09-26 MED ORDER — MIDAZOLAM HCL 2 MG/2ML IJ SOLN
2 MG/ML | INTRAMUSCULAR | Status: DC | PRN
Start: 2021-09-26 — End: 2021-09-26
  Administered 2021-09-26: 17:00:00 1 via INTRAVENOUS

## 2021-09-26 MED ORDER — FENTANYL CITRATE (PF) 100 MCG/2ML IJ SOLN
100 MCG/2ML | INTRAMUSCULAR | Status: DC | PRN
Start: 2021-09-26 — End: 2021-09-26
  Administered 2021-09-26 (×2): 50 via INTRAVENOUS

## 2021-09-26 MED ORDER — ONDANSETRON HCL 4 MG/2ML IJ SOLN
4 MG/2ML | INTRAMUSCULAR | Status: DC | PRN
Start: 2021-09-26 — End: 2021-09-26
  Administered 2021-09-26: 17:00:00 4 via INTRAVENOUS

## 2021-09-26 MED ORDER — MEPERIDINE HCL 25 MG/ML IJ SOLN
25 MG/ML | INTRAMUSCULAR | Status: DC | PRN
Start: 2021-09-26 — End: 2021-09-26

## 2021-09-26 MED ORDER — KETOROLAC TROMETHAMINE 30 MG/ML IJ SOLN
30 MG/ML | INTRAMUSCULAR | Status: DC | PRN
Start: 2021-09-26 — End: 2021-09-26
  Administered 2021-09-26: 18:00:00 30 via INTRAVENOUS

## 2021-09-26 MED ORDER — KETAMINE HCL 50 MG/ML IJ SOLN
50 MG/ML | INTRAMUSCULAR | Status: DC | PRN
Start: 2021-09-26 — End: 2021-09-26
  Administered 2021-09-26: 17:00:00 50 via INTRAVENOUS

## 2021-09-26 MED ORDER — FENTANYL CITRATE (PF) 100 MCG/2ML IJ SOLN
100 MCG/2ML | INTRAMUSCULAR | Status: DC | PRN
Start: 2021-09-26 — End: 2021-09-26
  Administered 2021-09-26 (×3): 25 ug via INTRAVENOUS

## 2021-09-26 MED ORDER — DIPHENHYDRAMINE HCL 50 MG/ML IJ SOLN
50 MG/ML | Freq: Once | INTRAMUSCULAR | Status: DC | PRN
Start: 2021-09-26 — End: 2021-09-26

## 2021-09-26 MED ORDER — HYDROMORPHONE HCL PF 1 MG/ML IJ SOLN
1 MG/ML | INTRAMUSCULAR | Status: DC | PRN
Start: 2021-09-26 — End: 2021-09-26

## 2021-09-26 NOTE — Anesthesia Pre-Procedure Evaluation (Signed)
Department of Anesthesiology  Preprocedure Note       Name:  Kim [name]   Age:  26 y.o.  DOB:  1995-10-27                                          MRN:  [id number]         Date:  09/26/2021      Surgeon: Moishe Spice):  Collier Flowers, MD    Procedure: Procedure(s):  OPERATIVE LAPAROSCOPY DILATATION AND CURETTAGE HYSTEROSCOPY    Medications prior to admission:   Prior to Admission medications    Medication Sig Start Date End Date Taking? Authorizing Provider   norethindrone-ethinyl estradiol (JUNEL FE 1/20) 1-20 MG-MCG per tablet Take 1 tablet by mouth daily    Historical Provider, MD   albuterol sulfate HFA (PROVENTIL;VENTOLIN;PROAIR) 108 (90 Base) MCG/ACT inhaler Inhale 2 puffs into the lungs every 4 hours as needed for Wheezing    Ar Automatic Reconciliation       Current medications:    No current facility-administered medications for this visit.     No current outpatient medications on file.     Facility-Administered Medications Ordered in Other Visits   Medication Dose Route Frequency Provider Last Rate Last Admin   . lidocaine PF 1 % injection 1 mL  1 mL IntraDERmal Once PRN Janeece Agee, MD       . fentaNYL (SUBLIMAZE) injection 25 mcg  25 mcg IntraVENous Once PRN Janeece Agee, MD        Or   . fentaNYL (SUBLIMAZE) injection 50 mcg  50 mcg IntraVENous Once PRN Janeece Agee, MD       . lactated ringers IV soln infusion   IntraVENous Continuous Janeece Agee, MD 125 mL/hr at 09/26/21 1158 NoRateChange at 09/26/21 1158       Allergies:    Allergies   Allergen Reactions   . Doxycycline Other (See Comments)       Problem List:    Patient Active Problem List   Diagnosis Code   . Hydronephrosis N13.30   . Pelvic pain R10.2   . 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 Chronic kidney disease N18.9   . 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 Cyst of ovary N83.209   . UPJ (ureteropelvic junction) obstruction N13.5   .  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 Bronchitis, chronic (HCC)    . Chronic kidney disease     congenital left hydronephrosis   . Hydronephrosis     congenital left side   . Irregular menses    . Pelvic pain    . Reactive airway disease        Past Surgical History:        Procedure Laterality Date   . ORTHOPEDIC SURGERY Bilateral     knee surgery   . OTHER SURGICAL HISTORY Bilateral     "clean out" bilaterally  knees   . OTHER SURGICAL HISTORY Right     Elbow surgery   . PELVIC LAPAROSCOPY     . TONSILLECTOMY AND ADENOIDECTOMY         Social History:    Social History  Tobacco Use   . Smoking status: Never   . Smokeless tobacco: Never   Substance Use Topics   . Alcohol use: No                                Counseling given: Not Answered      Vital Signs (Current):   There were no vitals filed for this visit.                                           BP Readings from Last 3 Encounters:   09/26/21 131/78       NPO Status:                                                                                 BMI:   Wt Readings from Last 3 Encounters:   09/26/21 200 lb (90.7 kg)   11/22/20 194 lb (88 kg)   05/19/20 190 lb (86.2 kg)     There is no height or weight on file to calculate BMI.    CBC:   Lab Results   Component Value Date/Time    WBC 7.3 07/10/2020 10:11 AM    RBC 4.73 07/10/2020 10:11 AM    HGB 14.2 07/10/2020 10:11 AM    HCT 41.1 07/10/2020 10:11 AM    MCV 86.9 07/10/2020 10:11 AM    PLT 290 07/10/2020 10:11 AM       CMP:   Lab Results   Component Value Date/Time    NA 139 07/10/2020 10:11 AM    K 3.8 07/10/2020 10:11 AM    CL 108 07/10/2020 10:11 AM    CO2 26 07/10/2020 10:11 AM    BUN 9 07/10/2020 10:11 AM    CREATININE 0.8 07/10/2020 10:11 AM    GFRAA >60.0 07/10/2020 10:11 AM    GLUCOSE 95 07/10/2020 10:11 AM    PROT 7.4 01/11/2018 05:40 PM    CALCIUM 9.4 07/10/2020 10:11 AM    BILITOT 0.4 01/11/2018 05:40 PM    ALKPHOS 55 01/11/2018 05:40 PM    AST 10  01/11/2018 05:40 PM    ALT 19 01/11/2018 05:40 PM       POC Tests: No results for input(s): POCGLU, POCNA, POCK, POCCL, POCBUN, POCHEMO, POCHCT in the last 72 hours.    Coags: No results found for: PROTIME, INR, APTT    HCG (If Applicable): No results found for: PREGTESTUR, PREGSERUM, HCG, HCGQUANT     ABGs: No results found for: PHART, PO2ART, PCO2ART, HCO3ART, BEART, O2SATART     Type & Screen (If Applicable):  No results found for: LABABO, LABRH    Drug/Infectious Status (If Applicable):  Lab Results   Component Value Date/Time    HEPCAB NON-REACTIVE 03/15/2019 04:10 PM       COVID-19 Screening (If Applicable): No results found for: COVID19        Anesthesia Evaluation  Patient summary reviewed and Nursing notes reviewed  Airway: Mallampati: II  TM distance: >3 FB   Neck  ROM: full     Dental: normal exam   (+) caps and implants      Pulmonary:Negative Pulmonary ROS and normal exam                               Cardiovascular:  Exercise tolerance: good (>4 METS),           Rhythm: regular  Rate: normal                    Neuro/Psych:   Negative Neuro/Psych ROS              GI/Hepatic/Renal: Neg GI/Hepatic/Renal ROS            Endo/Other: Negative Endo/Other ROS                    Abdominal:             Vascular: negative vascular ROS.         Other Findings:             Anesthesia Plan      general     ASA 2       Induction: intravenous.    MIPS: Prophylactic antiemetics administered.  Anesthetic plan and risks discussed with patient.      Plan discussed with CRNA.    Attending anesthesiologist reviewed and agrees with Preprocedure content                Vicente Males, MD   09/26/2021

## 2021-09-26 NOTE — Anesthesia Pre-Procedure Evaluation (Signed)
Department of Anesthesiology  Preprocedure Note       Name:  Kim [name]   Age:  26 y.o.  DOB:  11-10-1995                                          MRN:  [id number]         Date:  09/26/2021      Surgeon: Moishe Spice):  Collier Flowers, MD    Procedure: Procedure(s):  OPERATIVE LAPAROSCOPY DILATATION AND CURETTAGE HYSTEROSCOPY    Medications prior to admission:   Prior to Admission medications    Medication Sig Start Date End Date Taking? Authorizing Provider   norethindrone-ethinyl estradiol (JUNEL FE 1/20) 1-20 MG-MCG per tablet Take 1 tablet by mouth daily    Historical Provider, MD   albuterol sulfate HFA (PROVENTIL;VENTOLIN;PROAIR) 108 (90 Base) MCG/ACT inhaler Inhale 2 puffs into the lungs every 4 hours as needed for Wheezing    Ar Automatic Reconciliation       Current medications:    No current facility-administered medications for this visit.     No current outpatient medications on file.     Facility-Administered Medications Ordered in Other Visits   Medication Dose Route Frequency Provider Last Rate Last Admin   . lidocaine PF 1 % injection 1 mL  1 mL IntraDERmal Once PRN Janeece Agee, MD       . fentaNYL (SUBLIMAZE) injection 25 mcg  25 mcg IntraVENous Once PRN Janeece Agee, MD        Or   . fentaNYL (SUBLIMAZE) injection 50 mcg  50 mcg IntraVENous Once PRN Janeece Agee, MD       . lactated ringers IV soln infusion   IntraVENous Continuous Janeece Agee, MD 125 mL/hr at 09/26/21 1158 NoRateChange at 09/26/21 1158       Allergies:    Allergies   Allergen Reactions   . Doxycycline Other (See Comments)       Problem List:    Patient Active Problem List   Diagnosis Code   . Hydronephrosis N13.30   . Pelvic pain R10.2   . 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 Chronic kidney disease N18.9   . 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 Cyst of ovary N83.209   . UPJ (ureteropelvic junction) obstruction N13.5   .  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 Bronchitis, chronic (HCC)    . Chronic kidney disease     congenital left hydronephrosis   . Hydronephrosis     congenital left side   . Irregular menses    . Pelvic pain    . Reactive airway disease        Past Surgical History:        Procedure Laterality Date   . ORTHOPEDIC SURGERY Bilateral     knee surgery   . OTHER SURGICAL HISTORY Bilateral     "clean out" bilaterally  knees   . OTHER SURGICAL HISTORY Right     Elbow surgery   . PELVIC LAPAROSCOPY     . TONSILLECTOMY AND ADENOIDECTOMY         Social History:    Social History  Tobacco Use   . Smoking status: Never   . Smokeless tobacco: Never   Substance Use Topics   . Alcohol use: No                                Counseling given: Not Answered      Vital Signs (Current):   There were no vitals filed for this visit.                                           BP Readings from Last 3 Encounters:   09/26/21 131/78       NPO Status:                                                                                 BMI:   Wt Readings from Last 3 Encounters:   09/26/21 200 lb (90.7 kg)   11/22/20 194 lb (88 kg)   05/19/20 190 lb (86.2 kg)     There is no height or weight on file to calculate BMI.    CBC:   Lab Results   Component Value Date/Time    WBC 7.3 07/10/2020 10:11 AM    RBC 4.73 07/10/2020 10:11 AM    HGB 14.2 07/10/2020 10:11 AM    HCT 41.1 07/10/2020 10:11 AM    MCV 86.9 07/10/2020 10:11 AM    PLT 290 07/10/2020 10:11 AM       CMP:   Lab Results   Component Value Date/Time    NA 139 07/10/2020 10:11 AM    K 3.8 07/10/2020 10:11 AM    CL 108 07/10/2020 10:11 AM    CO2 26 07/10/2020 10:11 AM    BUN 9 07/10/2020 10:11 AM    CREATININE 0.8 07/10/2020 10:11 AM    GFRAA >60.0 07/10/2020 10:11 AM    GLUCOSE 95 07/10/2020 10:11 AM    PROT 7.4 01/11/2018 05:40 PM    CALCIUM 9.4 07/10/2020 10:11 AM    BILITOT 0.4 01/11/2018 05:40 PM    ALKPHOS 55 01/11/2018 05:40 PM    AST 10  01/11/2018 05:40 PM    ALT 19 01/11/2018 05:40 PM       POC Tests: No results for input(s): POCGLU, POCNA, POCK, POCCL, POCBUN, POCHEMO, POCHCT in the last 72 hours.    Coags: No results found for: PROTIME, INR, APTT    HCG (If Applicable): No results found for: PREGTESTUR, PREGSERUM, HCG, HCGQUANT     ABGs: No results found for: PHART, PO2ART, PCO2ART, HCO3ART, BEART, O2SATART     Type & Screen (If Applicable):  No results found for: LABABO, LABRH    Drug/Infectious Status (If Applicable):  Lab Results   Component Value Date/Time    HEPCAB NON-REACTIVE 03/15/2019 04:10 PM       COVID-19 Screening (If Applicable): No results found for: COVID19        Anesthesia Evaluation  Patient summary reviewed and Nursing notes reviewed  Airway: Mallampati: II  TM distance: >3 FB   Neck  ROM: full     Dental: normal exam         Pulmonary:Negative Pulmonary ROS and normal exam                               Cardiovascular:  Exercise tolerance: good (>4 METS),           Rhythm: regular  Rate: normal                    Neuro/Psych:   Negative Neuro/Psych ROS              GI/Hepatic/Renal: Neg GI/Hepatic/Renal ROS            Endo/Other: Negative Endo/Other ROS                    Abdominal:             Vascular: negative vascular ROS.         Other Findings:             Anesthesia Plan      general     ASA 2       Induction: intravenous.    MIPS: Prophylactic antiemetics administered.  Anesthetic plan and risks discussed with patient.      Plan discussed with CRNA.    Attending anesthesiologist reviewed and agrees with Preprocedure content                Vicente MalesHARLES Kaisy Severino, MD   09/26/2021

## 2021-09-26 NOTE — Op Note (Signed)
[name] MD  [address]  [address]  [place], [place] [address]  [phone]    Name: Kim [name]   Medical Record Number: [id number]   Date of Birth: 1995/06/03  Today's Date: September 26, 2021    Preop Diagnosis: Pelvic pain in female [R10.2]  Dysmenorrhea [N94.6]  Abnormal uterine bleeding [N93.9]  Postop Diagnosis:Same  Procedure: Procedure(s):  OPERATIVE LAPAROSCOPY DILATATION AND CURETTAGE HYSTEROSCOPY  Surgeon: Tresa Res. Kemiyah Tarazon, MD  Assistant: Surgeon(s):  Collier Flowers, MD   Anesthesia: General  EBL: None  Findings: Normal pelvic with normal ovaries without cyst, normal uterus and tubes.  No pelvic adhesions, no endometriosis.  Normal endometrial cavity with no polyps .  Minimal endometrial tissue   Specimens:   ID Type Source Tests Collected by Time Destination   A : [facility] Tissue Endometrium SURGICAL PATHOLOGY Collier Flowers, MD 09/26/2021 1344       Complications: none  Disposition: to the recovery room in stable condition      Procedure Details   Informed consent was obtained and risks of surgery discussed including damage to bowel, bladder, nerves and blood vessels.     The patient was taken to the operating room where general endotracheal anesthesia was obtained without difficulty. She was placed in the dorsal lithotomy position and prepared and draped in the sterile fashion. A Time Out was completed.  The bladder was emptied. A uterine manipulator was inserted into the uterine cavity.  After a change of gloves, attention was directed to the abdomen.   After infiltrating the site with 5-52ml of 0.5% marcaine with epinephrine, a 74mm skin incision was made just below the umbilicus..  With the abdominal wall elevated, a non-bladed 38mm trocar was advanced into the peritoneal cavity under direct visualization. Pneumoperitoneum to an intraabdominal pressure of 44mm Hg was achieved and correct placement within the peritoneal cavity was confirmed with no evidence of injury to bowel and no bleeding present.   The patient was then placed in trendelenburg position and after again infiltrating the space with 5-10 ml of local anesthetic,  second incision was made in the LLQ and 47mm non-bladed trocar advanced into the pelvis under direct visualization.  .  The pelvis was inspected in a systemic fashion, beginning with the anterior cul de sac and progressing through the posterior cul de sac and both annexa.  Operative findings were as noted above.         At the conclusion of the laparoscopy, after confirmation of hemostasis, the patient was taken out of trendelburg position and pneumoperitoneum reduced under direct viualization.  Once as much gas was expelled as possible, the trocars were removed. The skin incisions were closed with 4-0 monocryl and dermabond.     Attention was then directed to the vagina and cervix for the hysteroscopy.  The uterus was sounded to 7.5 cm length.  Minimal additional cervical dilation was done to accommodate the hysteroscope and using saline as the distending medium, hysteroscopy was performed with no abnormalities noted.  The endometrium was then firmed curetted but with minimal tissue obtained.    Sponge, needle, instrument counts were correct x 2. Patient returned to the recovery room in stable condition.         Author: Tresa Res. Kailynne Ferrington, MD

## 2021-09-26 NOTE — Discharge Instructions (Addendum)
[facility]  Outpatient Surgery Discharge Instructions      General Anesthesia/ Sedation or Local Anesthesia    Do not drive or operate machinery for 24 hours.  Do not consume alcohol, tranquilizers, sleeping medications or any non-prescribed medications for 24 hours.  Do not sign any important legal documents in the next 24 hours.  You should have a responsible adult drive you home and stay with you for the next 24 hours.  Avoid/curtail use of tobacco products. Nicotine will delay the healing process.      Activity    You are advised to go directly home from the surgery center. Restrict your activities for a day, resume light to moderate activity tomorrow, or as directed by physician.  You may resume normal activity today. Do not engage in any activity that may place stress on your incision.      Fluids and Diet    Begin with clear liquids, bouillon, jello, dry toast or crackers. If not nauseated, you may eat a regular diet when you desire. Greasy and spicy foods are not advised.    Special diet instructions: START WITH A LIGHT MEAL THEN ADVANCE AS TOLERATED.      Medications    YOU RECEIVED TORADOL(NSAID) 30mg  IV AT 1:30 PM.  YOU RECEIVED 1 PERCOCET TABLET AT 3:40 PM.    When taking pain medication, you may experience dizziness or drowsiness. Do not drink alcohol or drive when taking these medications. Do not take on an empty stomach. Consult with your surgeon to determine whether or not an over the counter stool softener is advisable to prevent constipation.  You may resume your daily medication schedule unless otherwise instructed by your physician.      Operative Site    Keep all dressings clean and dry.  Please follow any specific instructions for wound care provided by your surgeon.  Please refer to the instruction on handwashing. Lack of proper handwashing remains the number one source of infections.    It is important to wash your hands properly. This is the single best way to prevent  the spread of infections. Hand-washing can help keep you from getting sick. It is easy, doesn't cost much, and it works.    Make sure that you and your caregivers follow safe hand-washing routines. Caregivers may include health care workers or family members at home or in a care facility. You can talk to them about this information on hand-washing.      Gynecological Procedures    No tampons, douching or intercourse for 2 weeks or unless otherwise advised by your surgeon.  You may have varying amounts of vaginal drainage for a few days. Change pad as needed.  Laparoscopic patients may develop shoulder pain in the first 1 - 3 days from residual gas.      Additional Instructions: CALL DR. RUETZEL      Call your surgeon if you have any problems that concern you. After hours, you can reach your physician through his/her answering service. IF YOU NEED IMMEDIATE ATTENTION, GO TO THE NEAREST EMERGENCY DEPARTMENT.      SPECIFIC COMPLICATIONS TO WATCH FOR:   - Fever over 101 F, by mouth.   - Pain not relieved by pain medication ordered.   - Swelling, numbness or discoloration around the operative area.   - Increased redness, warmth, hardness, around operative site.   - Blood soaked dressing (small amount of drainage may be normal)   - Inability to urinate

## 2021-09-26 NOTE — Anesthesia Post-Procedure Evaluation (Signed)
Department of Anesthesiology  Postprocedure Note    Patient: Kim [name]  MRN: [id number]  Birthdate: [birth year]  Date of evaluation: 09/26/2021      Procedure Summary     Date: 09/26/21 Room / Location: [facility] MA[facility] OR    Anesthesia Start: 1254 Anesthesia Stop: 1405    Procedure: OPERATIVE LAPAROSCOPY DILATATION AND CURETTAGE HYSTEROSCOPY (Abdomen/Perineum) Diagnosis:       Pelvic pain in female      Dysmenorrhea      Abnormal uterine bleeding      (Pelvic pain in female [R10.2])      (Dysmenorrhea [N94.6])      (Abnormal uterine bleeding [N93.9])    Surgeons: Collier Flowers, MD Responsible Provider: Janeece Agee, MD    Anesthesia Type: General ASA Status: 2          Anesthesia Type: General    Aldrete Phase I: Aldrete Score: 7    Aldrete Phase II:        Anesthesia Post Evaluation    Patient location during evaluation: PACU  Patient participation: complete - patient participated  Level of consciousness: awake  Pain score: 2  Airway patency: patent  Nausea & Vomiting: no vomiting  Complications: no  Cardiovascular status: hemodynamically stable  Respiratory status: acceptable  Hydration status: euvolemic  Multimodal analgesia pain management approach

## 2021-10-31 NOTE — Telephone Encounter (Signed)
Patient scheduled with MRI-CT [place] on 11/06/21 at 8:00am.

## 2021-11-06 ENCOUNTER — Encounter

## 2021-11-09 ENCOUNTER — Encounter

## 2021-11-23 ENCOUNTER — Ambulatory Visit: Admit: 2021-11-23 | Payer: PRIVATE HEALTH INSURANCE | Attending: Physician Assistant | Primary: Family Medicine

## 2021-11-23 ENCOUNTER — Encounter: Attending: Physician Assistant | Primary: Family Medicine

## 2021-11-23 DIAGNOSIS — Q6211 Congenital occlusion of ureteropelvic junction: Secondary | ICD-10-CM

## 2021-11-23 NOTE — Progress Notes (Signed)
Kim [name]  DOB Apr 05, 1996  11/23/2021    ASSESSMENT:    ICD-10-CM    1. 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 Pelvic and perineal pain  R10.2           -Pelvic pain, chronic.  Bothersome.  No obvious cause for pain on prior imaging including CT urogram.  Urodynamics suggests a degree of dysfunctional voiding pattern with a high PVR.  However she is not bothered by a sense of straining to void or incomplete emptying.  Pelvic floor physical therapy did not seem to help symptoms. Tried/failed gabapentin, uribel, Atarax, valium suppositories, and PFPT. Did not try elavil due to concerns of possible side effects. Followed by Berneta Levins, underwent weekly methyl sulfoxide (Rimso-50) instillations. Ineffective. PVR 36 cc on RUS 11/22/2020.   Prescribed duloxetine by PCP   S/p OPERATIVE LAPAROSCOPY DILATATION AND CURETTAGE HYSTEROSCOPY 09/26/21. No pathological findings.     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  Resolved.  RUS 11/22/2020: bilateral pelvocaliectasis, no hydroureter.    Lasix renal scan 11/06/21: Mild left hydronephrosis without significant obstruction. No evidence of right kidney obstruction.     PLAN:  Patient > 5 years from pyeloplasty with no obstruction on recent lasix renal scan  Recommend she discuss D/C cymbalta with PCP given no symptom improvement and worsening sleep/nocturia  Consider referral to nuerology for chronic pelvic pain refractory to many treatments. Encouraged to discuss with PCP  Follow-up PRN       Chief Complaint   Patient presents with    Other     Pelvic, perineal pain       HPI  Kim [name] is a 26 y.o. female with a history of left UPJO, s/p left pyeloplasty 02/2015. Renal Scan 04/28/2018 demonstrated a good post-Lasix T1/2 time of 3 mins in the left kidney. Also with history of chronic pelvic pain. Presents today with her mom.     Patient is now followed by Dr. Norlene Campbell for chronic pelvic pain. She underwent bladder instillations weekly without improvement x  18 weeks. She did see ortho for a different lumbar spine pain that improved somewhat with injection.     She notes left flank pain at time of lasix scan that resolved without recurrence. Notes nocturia x 2, is not sleeping as well right now as she is on duloxetine prescribed by PCP 3 weeks ago to see if this helped.    Past Medical History:   Diagnosis Date    Bronchitis, chronic (HCC)     Chronic kidney disease     congenital left hydronephrosis    Hydronephrosis     congenital left side    Irregular menses     Pelvic pain     Reactive airway disease        Past Surgical History:   Procedure Laterality Date    ORTHOPEDIC SURGERY Bilateral     knee surgery    OTHER SURGICAL HISTORY Bilateral     "clean out" bilaterally  knees    OTHER SURGICAL HISTORY Right     Elbow surgery    PELVIC LAPAROSCOPY      TONSILLECTOMY AND ADENOIDECTOMY         Current Outpatient Medications   Medication Sig Dispense Refill    DULoxetine (CYMBALTA) 20 MG extended release capsule Take 1 capsule by mouth daily      norethindrone-ethinyl estradiol (JUNEL FE 1/20) 1-20 MG-MCG per tablet Take 1 tablet by mouth daily  albuterol sulfate HFA (PROVENTIL;VENTOLIN;PROAIR) 108 (90 Base) MCG/ACT inhaler Inhale 2 puffs into the lungs every 4 hours as needed for Wheezing       No current facility-administered medications for this visit.       Allergies   Allergen Reactions    Doxycycline Other (See Comments)       Family History   Problem Relation Age of Onset    Cancer Paternal Grandmother     Cancer Paternal Grandfather        Social History     Socioeconomic History    Marital status: Single     Spouse name: None    Number of children: None    Years of education: None    Highest education level: None   Tobacco Use    Smoking status: Never    Smokeless tobacco: Never   Vaping Use    Vaping Use: Never used   Substance and Sexual Activity    Alcohol use: No    Drug use: No         PHYSICAL EXAM:  There were no vitals filed for this visit.    GEN:  NAD, alert and oriented  PULM: nl resp effort, no distress  PSYCH: Nl mood and affect    LABS:  No results found for this visit on 11/23/21.    RADIOLOGY:    Lasix Renal Scan (11/06/2021)  FLOW: There is prompt and symmetric flow to the bilateral kidneys.     SPLIT FUNCTION: Differential function is 47.2 % on the left and 52.8 % on the right.     LEFT KIDNEY:     There is appropriate vascular transit time and homogeneous cortical uptake on the flow images. Renogram curve shows normal peak activity at 2.00 minutes.  There is normal radionuclide excretion on the functional dynamic images and mild left hydronephrosis.  The pre-Lasix T1/2 is 7.12 minutes.  The uptake percentage at 20 minutes is 5.78 %.  Following Lasix administration, there is prompt excretion of the radiotracer indicating no obstructive uropathy.  The post-Lasix T1/2 is 12.0 minutes.     RIGHT KIDNEY:     There is appropriate vascular transit time and homogeneous cortical uptake on the flow images. Renogram curve shows normal peak activity at 2.20 minutes.  There is normal radionuclide excretion on the functional dynamic images.  The pre-Lasix T1/2 is 5.78 minutes.  The uptake percentage at 20 minutes is 25.3 %.  Following Lasix administration, there is prompt excretion of the radiotracer indicating no obstructive uropathy.  The post-Lasix T1/2 is 90.9 minutes.     POST VOID: Persistent mild left hydronephrosis and mild collecting system activity in the left kidney on postvoid images is noted.  No significant persistent right kidney collecting system activity is noted on post void imaging. There is radiotracer remaining in the bladder.     IMPRESSION:     1.  Mild left hydronephrosis without significant obstruction.   2.  No evidence of right kidney obstruction.      Renal US 11/22/2020  The RIGHT kidney measures 11.14 x 5.67 x 5.22cm. There was mild pelvocaliectasis noted without hydroureter. No change with post void. Normal echotexture and blood flow  by color Doppler noted throughout.     The LEFT kidney measures 11.26 x 5.92 x 5.57cm. There was pelvocaliectasis noted without hydroureter. 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 Normal echotexture and blood flow by color Doppler noted throughout.     The bladder was distended at 391 cc's.  The right urine jet was noted. The left urine jet was not seen in 5 minute period. The post void bladder measured 36 cc's. Incomplete bladder empty.     Impression:   Right mild pelvocaliectasis, no hydroureter. No change with post void.   Left pelvocaliectasis, no hydroureter. No change with post void.   Left malrotated kidney.   Distended bladder, 391 cc's. Right urine jet noted. Left urine jet not seen.   Post void bladder = 36 cc's. Incomplete bladder empty.     MRI Pelvis 06/14/2020  UTERUS: The uterus measures 5.3 x 4.6 x 4.4 cm. There is borderline junctional  zone thickening measuring up to 14 mm. There is IUD within the uterus. There is  soft tissue fullness with increased T2 signal involving the cervix measuring up  to 19 mm. Mild enhancement is noted. Otherwise, no focal uterine mass lesion.     OVARIES/ADNEXA: Multiple bilateral ovarian follicles and cysts. Largest measures  2.1 cm in the left ovary. Follicles have peripheral orientation, raising  possibility for polycystic ovarian syndrome. No other adnexal mass lesions.     OTHER DISCUSSION: Moderate free fluid in the pelvic cul-de-sac. Urinary bladder  is grossly unremarkable.     IMPRESSION:  1.  IUD within the uterus  2.  Borderline junctional zone thickening of the uterus, raising possibility for  adenomyosis.  3.  Soft tissue fullness and enhancement involving the cervix. This may reflect  complex nabothian cyst. Inflammatory/neoplastic process of the cervix is not  excluded. Further evaluation is recommended.  4.  Multiple peripheral follicles and cysts within the bilateral ovaries.  Findings raise possibility for polycystic ovarian syndrome. Largest cyst  measures 2.1 cm  and the left ovary.  5.  Moderate free fluid in the pelvic cul-de-sac.     CTU 05/07/19  Precontrast images reveal no urinary, biliary, or vascular calcifications.  CT ABDOMEN:  Lung bases: Clear.  Liver:  Unremarkable  Spleen: Unremarkable  Left Kidney: The kidney is partially malrotated with the collecting system anterior rather than anteromedial. There is a prominent extrarenal pelvis and mild caliectasis. No ureteral dilation is evident. No filling defects are evident. No renal pelvis or ureteral stricture is apparent.. No UPJ obstruction is present  Right Kidney: Negative.  Pancreas: Negative.  Adrenal glands: Normal.  Stomach, Small Bowel and Colon: Unremarkable.  No lymphadenopathy evident.  The abdominal aorta and cava are unremarkable  Peritoneal aces: There is no free fluid or free air.  !!!!!er:The bladder is unremarkable  Uterus and adnexal structures are only partially cystlike hypodensity in the region of the cervix, probably nabothian. Otherwise negative     IMPRESSION   Partially malrotated left kidney.   Prominent extrarenal pelvis and mild caliectasis in the left kidney. Unremarkable urinary tract otherwise.   No obstructive abnormality is evident.      Renal Scan 04/28/2018  IMPRESSION:  1. Delayed transit and excretion of the left kidney secondary to UPJ obstruction  2. Normal function of the right kidney.    Imaging Report Reviewed? YES   Type:  lasix scan  Images Reviewed?     NO      Type: n/a    Labs Reviewed?  NO  Other Data Reviewed? YES- PCP note, OR note from recent procedure       Vickki Muff PA-C  [facility]     IO:NGEXB George Hugh, MD

## 2021-12-19 ENCOUNTER — Encounter

## 2023-05-29 ENCOUNTER — Inpatient Hospital Stay: Admit: 2023-05-29 | Payer: PRIVATE HEALTH INSURANCE | Primary: Family Medicine

## 2023-05-29 DIAGNOSIS — M62838 Other muscle spasm: Secondary | ICD-10-CM

## 2023-05-29 NOTE — Progress Notes (Signed)
 OUTPATIENT PHYSICAL THERAPY  INITIAL EVALUATION    Patient: Kim [name]       Age: 28 y.o.     DOB: 11-15-1995       MRN: [id number]  Date: 05/29/2023  Referring Physician: Delray Alt, MD  Diagnosis: Other muscle spasm [M62.838]  Pelvic and perineal pain [R10.2]  Prescription: evaluate and treat     Provider of Established POC: Koren Shiver, MPT     Diagnosis: Other muscle spasm [M62.838]  Pelvic and perineal pain [R10.2]   Treatment Diagnosis: pelvic floor muscle dysfunction M62.89   Frequency/Duration: 1-2 times per week for 18-24 visits.       SUBJECTIVE:   Chief complaint/symptom progression: Patient reports that while in college, she had surgery to drain fluid from her L kidney. Recovered well from surgery and there were no issues. Then when she graduated from college in 2019, she started having extremely painful periods. Initially it was thought to be due to cysts. And she has had two exploratory laparoscopies which ruled out endometriosis.  She possibly has PCOS, but has not yet been confirmed. IUD was removed, but pain did not improve. She experiences pelvic pain daily, but the intensity increases with and/or after activity/sports/exercise. For pain relief she uses a heating pad and curls into fetal position.   She has had bladder installations with no benefit. She has had PFPT in the past with no benefit (breathing, kegel exercises). She has been receiving TP injections from urogyn in [place] which have been beneficial in reducing pain. She receives them every 12 weeks, with the most recent done yesterday. (May 6th will be her next session). She was issued a TPR wand (Intimate Rose) but admits to not using often because it causes her to have more pain.   CT scans yearly of kidney to ensure hydronephrosis has not returned.   Date of Onset:  2019  Pain rating:   2-10/10 range. Extreme pain not as often since starting TPIs  Date of last pelvic/prostate exam:  Feb 2023  Medications: Reviewed in  chart.  Allergies: none  Precautions:  none specified on prescription   Past Medical History:   Past Medical History:   Diagnosis Date    Bronchitis, chronic (HCC)     Chronic kidney disease     congenital left hydronephrosis    Hydronephrosis     congenital left side    Irregular menses     Pelvic pain     Reactive airway disease      Pelvic/urogenital surgical History: none    Previous Physical Therapy/treatment to date:  has had PFPT in the past   Patient's Goal:  less pain        LIFESTYLE/QUALITY OF LIFE/FUNCTIONAL LIMITATIONS  Childbirth history: none  Occupation:  Scientist, product/process development   Social activities:    Physical activity/exercise: limited currently due to increase in pain after sports, exercise   Sexually Active: n/a patient reports that she has not yet had intercourse.   Pain during or after sexual activity:  n/a   Pain or lack of sensation with vaginal exams:  not reported   Vaginal dryness:  none reported       BLADDER HABITS  Frequency of urination:     During awake hours:  4          During night hours:  2            Urine stream:  normal   Subjective Urine volume:  normal   Diet:  no special dietary considerations reported   Fluid Intake per day: 64 oz of water per day, occasional soda. No caffeine or alcohol.   Bladder irritants:  none      URINARY INCONTINENCE SYMPTOMS  Episodes of Urinary Leakage:  not daily   Leakage amount:  small  Symptom Aggravators:  sneezing, running  Symptom relief:     Form of protection: none   Pad changes in 24 hours: n/a    BOWEL HABITS:  Bowel Sensation:  intact   Urge delay:  WNL  Frequency of BMs:     1x    Per day           Per week  Strain with BM:               Yes              x  No  Past attempts of managing constipation: n/a  [place] Stool Chart: Type 3 - Sausage-like, but with surface cracks and Type 4 - Sausage-like, smooth and soft    ANAL INCONTINENCE SYMPTOMS:  Episodes of Fecal leakage:  none    OBJECTIVE:     FUNCTIONAL TEST: QOL & Symptoms Distress  Inventory (UDI/IIQ) 31%    CHAPERONED:  No    SIGNED CONSENT:  Yes    Trunk/lumbar range of Motion: grossly WNL        EXTERNAL/VISUAL PERINEAL EXAM: deferred today.     PELVIC FLOOR INTERNAL EXAM: deferred today.       TODAY'S TREATMENT:   Evaluation followed by patient education on the purpose of pelvic floor PT as it pertains to her condition.    ASSESSMENT:   Patient is a 28 year old female who presents with pelvic floor muscle dysfunction, pelvic pain and spasm that increases with activity/exercise/sports and mild SUI.   She is receiving PFM TPIs every three months.     GOALS:   Goals were created from input and consultation with patient.      To be achieved in 18-24 visits.    STG:   Patient to be independent with appropriate pelvic floor home exercise program.    LTG:  Normal resting tone of PFM.   Diminished pelvic pain.   Absent urine leakage with sneezing, running.   Patient to report a reduction in Quality of Life & Symptoms Distress Inventory impairment to 10 % or less.    Rehab Potential:  good to meet the above stated goals.  Barriers to achieving goals: none foreseen at this time.     PLAN:   Based on the above objective findings, the patient will benefit from skilled Physical Therapy intervention to address the found impairments.     Physical Therapy 1-2 times/week for 18-24 visits to include the following treatment of Therapeutic Exercise, Neuromuscular Electrical Stimulation, Manual Therapy, Biofeedback, and Teaching of a Home Exercise Program with Patient Education    Thank you for this referral.          Koren Shiver, MPT

## 2023-06-06 ENCOUNTER — Encounter: Payer: PRIVATE HEALTH INSURANCE | Primary: Family Medicine

## 2023-06-12 ENCOUNTER — Inpatient Hospital Stay: Admit: 2023-06-12 | Payer: PRIVATE HEALTH INSURANCE | Primary: Family Medicine

## 2023-06-12 NOTE — Progress Notes (Signed)
 [address] [place], [place] [address]  [phone]    PHYSICAL THERAPY DAILY NOTE      Patient Name: Kim [name] DOB: Sep 22, 1995   Treatment/Medical Diagnosis: Other muscle spasm [M62.838]  Pelvic and perineal pain [R10.2]   Referral Source: Delray Alt, MD  Total session time: 55 min    Date of Initial Visit: 05/29/23 Attended Visits: 2  Total treatment time: 45 min        SUBJECTIVE/CURRENT STATUS:  States that she is spotting currently.   Bowels are moving well.     Pain Level (0-10 scale): 5-6/10    Next appointment for TP injections is in May.       OBJECTIVE:    SUMMARY OF TREATMENT:  In supported hooklying position:   Patient received abdominal STM/MFR, diaphragm release and bowel massage x 45 min for tension and pain reduction.      Patient education: review of diaphragmatic breathing     Rationale for treatment: decrease pelvic tension and pain      WNL Improving Limited N/A   []  Increase Pelvic MM Strength []   []    []    [x]      []   Decrease Pelvic MM Hypertonus []    []    []    [x]      []   Decrease Incontinence Episodes []   []    []    [x]      []   Improve Voiding Habits []    []    []    [x]     []  Decrease Urgency []   []   []   [x]         OTHER OBJECTIVE/FUNCTIONAL MEASUREMENTS:   [] baseline resting tone/EMG:  n/a    MUSCLE STRENGTH/PERF score:        ASSESSMENT:  Tolerated manual therapy well.   Tension noted throughout abdominals and diaphragm.   No scar tissue adhesions noted from previous surgeries, laparoscopies.         PLAN:  Continue with skilled pelvic floor PT plan of care, progressing as able or modifying when needed.

## 2023-06-18 ENCOUNTER — Inpatient Hospital Stay: Admit: 2023-06-18 | Payer: PRIVATE HEALTH INSURANCE | Primary: Family Medicine

## 2023-06-18 DIAGNOSIS — M62838 Other muscle spasm: Secondary | ICD-10-CM

## 2023-06-18 NOTE — Progress Notes (Signed)
 [address], [place], [place] [address]  [phone]    PHYSICAL THERAPY DAILY NOTE      Patient Name: Kim [name] DOB: 26-Feb-1996   Treatment/Medical Diagnosis: Other muscle spasm [M62.838]  Pelvic and perineal pain [R10.2]   Referral Source: Delray Alt, MD  Total session time: 55 min    Date of Initial Visit: 05/29/23 Attended Visits: 3  Total treatment time: 45 min        SUBJECTIVE/CURRENT STATUS:  No new complaints. Felt fine after last session.     Bowels are moving well.     Pain Level (0-10 scale): 5-6/10    Next appointment for TP injections is in May.       OBJECTIVE:    SUMMARY OF TREATMENT:    Review of purpose and procedure involved with intra-vaginal NMES. Patient verbalized understanding and consent.   In supported hooklying position: careful insertion of well lubricated tampon size vaginal sensor.   sEMG assessment of PFM activity at rest, during and post contractions. Education given on EMG graph and readings.   Then initiated intra-vaginal NMES alternating stimulation protocol x 45 min, intensity to tolerance at 25.0 mA.        Patient education: review of diaphragmatic breathing     Rationale for treatment: decrease pelvic tension and pain      WNL Improving Limited N/A   []  Increase Pelvic MM Strength []   []    []    [x]      []   Decrease Pelvic MM Hypertonus []    []    []    [x]      []   Decrease Incontinence Episodes []   []    []    [x]      []   Improve Voiding Habits []    []    []    [x]     []  Decrease Urgency []   []   []   [x]         OTHER OBJECTIVE/FUNCTIONAL MEASUREMENTS:   [x] baseline resting tone/EMG:  10-12 mV    MUSCLE STRENGTH/PERF score:        ASSESSMENT:  Discomfort noted during insertion/placement of treatment sensor, but refrained from muscle guarding, clenching.    Decent visibility of PFM contractions on EMG graph with good return to resting baseline.   NMES was well tolerated at above intensity with good sensation reported.         PLAN:  Continue with skilled pelvic  floor PT plan of care, progressing as able or modifying when needed.

## 2023-06-26 ENCOUNTER — Inpatient Hospital Stay: Admit: 2023-06-26 | Payer: PRIVATE HEALTH INSURANCE | Primary: Family Medicine

## 2023-06-26 NOTE — Progress Notes (Signed)
 [address], [place], [place] [address]  [phone]    PHYSICAL THERAPY DAILY NOTE      Patient Name: Kim [name] DOB: June 22, 1995   Treatment/Medical Diagnosis: Other muscle spasm [M62.838]  Pelvic and perineal pain [R10.2]   Referral Source: Delray Alt, MD  Total session time: 55 min    Date of Initial Visit: 05/29/23 Attended Visits: 4  Total treatment time: 45 min        SUBJECTIVE/CURRENT STATUS:  Reports that she had a lot of pelvic pain for several days after last treatment session with NMES.   Would be willing to try it again, but suggests maybe alternating sessions of manual treatment and NMES.     Bowels are moving well.     Pain Level (0-10 scale): 5-6/10    Next appointment for TP injections is in May.       OBJECTIVE:    SUMMARY OF TREATMENT:  NOT DONE TODAY: In supported hooklying position: careful insertion of well lubricated tampon size vaginal sensor.   sEMG assessment of PFM activity at rest, during and post contractions. Education given on EMG graph and readings.   Then initiated intra-vaginal NMES alternating stimulation protocol x 45 min, intensity to tolerance at 25.0 mA.     TODAY'S TREATMENT: Patient received abdominal STM/MFR, diaphragm release and bowel massage x 45 min for tension and pain reduction.      Patient education: review of diaphragmatic breathing     Rationale for treatment: decrease abdominal/pelvic tension and pain      WNL Improving Limited N/A   []  Increase Pelvic MM Strength []   []    []    [x]      []   Decrease Pelvic MM Hypertonus []    []    []    [x]      []   Decrease Incontinence Episodes []   []    []    [x]      []   Improve Voiding Habits []    []    []    [x]     []  Decrease Urgency []   []   []   [x]         OTHER OBJECTIVE/FUNCTIONAL MEASUREMENTS:   [] baseline resting tone/EMG:  n/a    MUSCLE STRENGTH/PERF score:        ASSESSMENT:  Tolerated manual therapy well. Tension noted throughout abdominals and diaphragm.   Patient is willing to attempt NMES again, but  with alternating sessions of manual therapy.          PLAN:  Continue with skilled pelvic floor PT plan of care, progressing as able or modifying when needed.

## 2023-07-03 ENCOUNTER — Inpatient Hospital Stay: Admit: 2023-07-03 | Payer: PRIVATE HEALTH INSURANCE | Primary: Family Medicine

## 2023-07-03 NOTE — Progress Notes (Signed)
 [address] [place], [place] [address]  [phone]    PHYSICAL THERAPY DAILY NOTE      Patient Name: Kim [name] DOB: 1996/01/14   Treatment/Medical Diagnosis: Other muscle spasm [M62.838]  Pelvic and perineal pain [R10.2]   Referral Source: Delray Alt, MD  Total session time: 55 min    Date of Initial Visit: 05/29/23 Attended Visits: 5  Total treatment time: 45 min        SUBJECTIVE/CURRENT STATUS:  Pain is pretty much the same.   Saw my GYN in [place] on Monday and keeping treatments the same.   Next TP injection is scheduled in May.     Bowels are moving well.     Pain Level (0-10 scale): 5-6/10      OBJECTIVE:    SUMMARY OF TREATMENT:  In supported hooklying position: careful insertion of well lubricated tampon size vaginal sensor.   sEMG assessment of PFM activity at rest, during and post contractions.   Then proceeded with intra-vaginal NMES alternating stimulation protocol x 45 min, intensity to tolerance at 27.0 mA.     NOT DONE TODAY: Patient received abdominal STM/MFR, diaphragm release and bowel massage x 45 min for tension and pain reduction.      Patient education: review of diaphragmatic breathing     Rationale for treatment: decrease abdominal/pelvic tension and pain      WNL Improving Limited N/A   []  Increase Pelvic MM Strength []   []    []    [x]      []   Decrease Pelvic MM Hypertonus []    []    []    [x]      []   Decrease Incontinence Episodes []   []    []    [x]      []   Improve Voiding Habits []    []    []    [x]     []  Decrease Urgency []   []   []   [x]         OTHER OBJECTIVE/FUNCTIONAL MEASUREMENTS:   [x] baseline resting tone/EMG:  10-15 mV    MUSCLE STRENGTH/PERF score:        ASSESSMENT:  Discomfort noted during insertion of treatment sensor, but without reflexive guarding.   Decent single contractions noted on EMG graph, with good return to resting baseline. No increase in pelvic pain reported during or post contraction.   NMES was well tolerated at above intensity with good  sensation reported.       PLAN:  Continue with skilled pelvic floor PT plan of care, progressing as able or modifying when needed. Currently alternating manual therapy and NMES.

## 2023-07-10 ENCOUNTER — Inpatient Hospital Stay: Admit: 2023-07-10 | Payer: PRIVATE HEALTH INSURANCE | Primary: Family Medicine

## 2023-07-10 NOTE — Progress Notes (Signed)
 [address] [place], [place] [address]  [phone]    PHYSICAL THERAPY DAILY NOTE      Patient Name: Kim [name] DOB: [birth year]   Treatment/Medical Diagnosis: Other muscle spasm [M62.838]  Pelvic and perineal pain [R10.2]   Referral Source: Delray Alt, MD  Total session time: 55 min    Date of Initial Visit: 05/29/23 Attended Visits: 6  Total treatment time: 45 min        SUBJECTIVE/CURRENT STATUS:  Pain was more intense after last session (with NMES).     Next TP injection is scheduled in May.     Bowels are moving well.     Pain Level (0-10 scale): 5-6/10      OBJECTIVE:    SUMMARY OF TREATMENT:  NOT DONE TODAY: In supported hooklying position: careful insertion of well lubricated tampon size vaginal sensor.   sEMG assessment of PFM activity at rest, during and post contractions.   Then proceeded with intra-vaginal NMES alternating stimulation protocol x 45 min, intensity to tolerance at 27.0 mA.     TODAY'S TREATMENT: in supported hooklying with wedge, initiated external MT/TPR/MFR in pelvic clock fashion. Total time = 45 min.        Patient education: review of diaphragmatic breathing     Rationale for treatment: decrease abdominal/pelvic tension and pain      WNL Improving Limited N/A   []  Increase Pelvic MM Strength []   []    []    [x]      []   Decrease Pelvic MM Hypertonus []    []    []    [x]      []   Decrease Incontinence Episodes []   []    []    [x]      []   Improve Voiding Habits []    []    []    [x]     []  Decrease Urgency []   []   []   [x]         OTHER OBJECTIVE/FUNCTIONAL MEASUREMENTS:   [] baseline resting tone/EMG:  n/a    MUSCLE STRENGTH/PERF score:        ASSESSMENT:  Fair tolerance to external manual therapy. Most intense pain located at levator ani bilaterally.   Palpable fasciculations noted during attempted releases.       PLAN:  Continue with skilled pelvic floor PT plan of care, progressing as able or modifying when needed. Currently alternating manual therapy and NMES.

## 2023-07-15 DIAGNOSIS — M62838 Other muscle spasm: Secondary | ICD-10-CM

## 2023-07-15 NOTE — Progress Notes (Signed)
Patient cancelled today's appointment.

## 2023-07-16 ENCOUNTER — Inpatient Hospital Stay: Payer: PRIVATE HEALTH INSURANCE | Primary: Family Medicine

## 2023-07-17 ENCOUNTER — Inpatient Hospital Stay: Admit: 2023-07-17 | Payer: PRIVATE HEALTH INSURANCE | Primary: Family Medicine

## 2023-07-17 NOTE — Progress Notes (Signed)
 [address] [place], [place] [address]  [phone]    PHYSICAL THERAPY DAILY NOTE      Patient Name: Kim [name] DOB: 1996-03-27   Treatment/Medical Diagnosis: Other muscle spasm [M62.838]  Pelvic and perineal pain [R10.2]   Referral Source: Delray Alt, MD  Total session time: 55 min    Date of Initial Visit: 05/29/23 Attended Visits: 7  Total treatment time: 45 min        SUBJECTIVE/CURRENT STATUS:  Pain was much more intense after last session, to the point that it made me cry.   I have been trying to do more stretching to see if it will help.    Next TP injection is scheduled in May.     Pain Level (0-10 scale): not rated currently      OBJECTIVE:    SUMMARY OF TREATMENT:  NOT DONE TODAY: In supported hooklying position: careful insertion of well lubricated tampon size vaginal sensor.   sEMG assessment of PFM activity at rest, during and post contractions.   Then proceeded with intra-vaginal NMES alternating stimulation protocol x 45 min, intensity to tolerance at 27.0 mA.     TODAY'S TREATMENT: in supported hooklying with wedge, resumed abdominal STM and gentle MFR to bilateral iliopsoas. Deferred MT to external PFM. Total time = 45 min.        Patient education: review of diaphragmatic breathing     Rationale for treatment: decrease abdominal/pelvic tension and pain      WNL Improving Limited N/A   []  Increase Pelvic MM Strength []   []    []    [x]      []   Decrease Pelvic MM Hypertonus []    []    []    [x]      []   Decrease Incontinence Episodes []   []    []    [x]      []   Improve Voiding Habits []    []    []    [x]     []  Decrease Urgency []   []   []   [x]         OTHER OBJECTIVE/FUNCTIONAL MEASUREMENTS:   [] baseline resting tone/EMG:  n/a    MUSCLE STRENGTH/PERF score:        ASSESSMENT:  Deferred MT to external PFM due to poor tolerance, resulting in significant increase in pain intensity and duration.   She seemed to tolerate today's session of MT to abdomen, psoas fairly well.        PLAN:  Continue with skilled pelvic floor PT plan of care, progressing as able or modifying when needed. Review current home stretching regimen and add any additional pertinent stretches.

## 2023-07-22 ENCOUNTER — Encounter: Payer: PRIVATE HEALTH INSURANCE | Primary: Family Medicine

## 2023-07-24 ENCOUNTER — Inpatient Hospital Stay: Admit: 2023-07-24 | Payer: PRIVATE HEALTH INSURANCE | Primary: Family Medicine

## 2023-07-24 NOTE — Progress Notes (Signed)
 [address], [place], [place] [address]  [phone]    PHYSICAL THERAPY DAILY NOTE      Patient Name: Kim [name] DOB: April 29, 1995   Treatment/Medical Diagnosis: Other muscle spasm [M62.838]  Pelvic and perineal pain [R10.2]   Referral Source: Delray Alt, MD  Total session time: 55 min    Date of Initial Visit: 05/29/23 Attended Visits: 8  Total treatment time: 45 min        SUBJECTIVE/CURRENT STATUS:  Pain was not as bad after last session.     Next TP injection is scheduled in May.     Pain Level (0-10 scale): not rated currently      OBJECTIVE:    SUMMARY OF TREATMENT:  NOT DONE TODAY: In supported hooklying position: careful insertion of well lubricated tampon size vaginal sensor.   sEMG assessment of PFM activity at rest, during and post contractions.   Then proceeded with intra-vaginal NMES alternating stimulation protocol x 45 min, intensity to tolerance at 27.0 mA.     TODAY'S TREATMENT: in supported hooklying with wedge, patient received gentile MFR/STM to abdominal musculature, diaphragm and iliopsoas. Total time = 45 min.      Patient education: review of her current hip and low back stretching regimen; added happy baby stretch.   Emphasized the importance of being consistent with stretching to receive the maximum benefit. Patient verbalized understanding.     Rationale for treatment: decrease abdominal/pelvic tension and pain      WNL Improving Limited N/A   []  Increase Pelvic MM Strength []   []    []    [x]      []   Decrease Pelvic MM Hypertonus []    []    []    [x]      []   Decrease Incontinence Episodes []   []    []    [x]      []   Improve Voiding Habits []    []    []    [x]     []  Decrease Urgency []   []   []   [x]         OTHER OBJECTIVE/FUNCTIONAL MEASUREMENTS:   [] baseline resting tone/EMG:  n/a    MUSCLE STRENGTH/PERF score:        ASSESSMENT:  Good tolerance to manual therapy today.   Noted decrease in resting tension of abdomen after treatment.        PLAN:  Continue with skilled pelvic  floor PT plan of care, progressing as able or modifying when needed.

## 2023-07-29 ENCOUNTER — Encounter: Payer: PRIVATE HEALTH INSURANCE | Primary: Family Medicine

## 2023-08-07 ENCOUNTER — Encounter: Payer: PRIVATE HEALTH INSURANCE | Primary: Family Medicine

## 2023-08-12 ENCOUNTER — Encounter: Payer: PRIVATE HEALTH INSURANCE | Primary: Family Medicine

## 2023-08-14 ENCOUNTER — Encounter: Payer: PRIVATE HEALTH INSURANCE | Primary: Family Medicine

## 2023-08-19 ENCOUNTER — Encounter: Payer: PRIVATE HEALTH INSURANCE | Primary: Family Medicine

## 2023-08-21 ENCOUNTER — Inpatient Hospital Stay: Admit: 2023-08-21 | Payer: PRIVATE HEALTH INSURANCE | Primary: Family Medicine

## 2023-08-21 DIAGNOSIS — M62838 Other muscle spasm: Secondary | ICD-10-CM

## 2023-08-21 NOTE — Progress Notes (Signed)
 [address] [place], [place] [address]  [phone]    PHYSICAL THERAPY DAILY NOTE      Patient Name: Kim [name] DOB: 1995/04/23   Treatment/Medical Diagnosis: Other muscle spasm [M62.838]  Pelvic and perineal pain [R10.2]   Referral Source: Clemmie Cyphers, MD  Total session time: 55 min    Date of Initial Visit: 05/29/23 Attended Visits: 9  Total treatment time: 45 min        SUBJECTIVE/CURRENT STATUS:  I have been doing my stretches pretty consistently until I got sick with COVID last week. I've not had too much pain since the last time I saw you. But I do have some pain today.      Next TP injection is this month.     Pain Level (0-10 scale): not rated currently      OBJECTIVE:    SUMMARY OF TREATMENT:      TODAY'S TREATMENT: Discussion of purpose and procedure involved with desensitization technique using xsmall dilator. Patient verbalized understanding and consent.   In supported hooklying with wedge: prepped xsmall vaginal dilator with lubricant. Gently inserted dilator  approximately 1/3 of the way into vaginal canal and left propped into position for 10 min. After 10 min check, gently proceeded with further insertion of dilator and left propped into position for an additional 15 min. After 15 min check, dilator was repositioned due to having migrated out partially. Then dilator remained in position for an additional 15 min.     Patient education: review of her current hip and low back stretching regimen; added happy baby stretch.   Emphasized the importance of being consistent with stretching to receive the maximum benefit. Patient verbalized understanding.     Rationale for treatment: decrease abdominal/pelvic tension and pain      WNL Improving Limited N/A   []  Increase Pelvic MM Strength []   []    []    [x]      []   Decrease Pelvic MM Hypertonus []    []    []    [x]      []   Decrease Incontinence Episodes []   []    []    [x]      []   Improve Voiding Habits []    []    []    [x]     []  Decrease  Urgency []   []   []   [x]         OTHER OBJECTIVE/FUNCTIONAL MEASUREMENTS:   [] baseline resting tone/EMG:  n/a    MUSCLE STRENGTH/PERF score:        ASSESSMENT:  R sided vaginal pain during initial attempt at inserting dilator. Otherwise tolerated treatment well.   She is due for another series of TPIs this month which will be beneficial.        PLAN:  Continue with skilled pelvic floor PT plan of care, progressing as able or modifying when needed.

## 2023-09-04 NOTE — Progress Notes (Signed)
 Patient arrived for today's scheduled appointment, but it was decided to re-schedule due to having recent botox injections and increase in pain/soreness.

## 2023-09-05 ENCOUNTER — Inpatient Hospital Stay: Payer: PRIVATE HEALTH INSURANCE | Primary: Family Medicine

## 2023-09-11 ENCOUNTER — Inpatient Hospital Stay: Admit: 2023-09-11 | Payer: PRIVATE HEALTH INSURANCE | Primary: Family Medicine

## 2023-09-11 NOTE — Progress Notes (Addendum)
 [address] [place], [place] [address]  [phone]    PHYSICAL THERAPY DAILY NOTE      Patient Name: Kim [name] DOB: Feb 26, 1996   Treatment/Medical Diagnosis: Pelvic pain, spasm    Referral Source: Clemmie Cyphers, MD  Total session time: 55 min    Date of Initial Visit: 05/29/23 Attended Visits: 10  Total treatment time: 45 min        SUBJECTIVE/CURRENT STATUS:  This last round of botox injections was more intense. It wasn't my normal doctor because she was out on maternity leave.  I had a lot of pain for a week after the injections. Still not 100% now, but better than it was.     Pain Level (0-10 scale): not rated currently      OBJECTIVE:    SUMMARY OF TREATMENT:      TODAY'S TREATMENT:   Discussion of change in protocol due to poor tolerance with attempts at manual therapy and NMES.   In supported hooklying with wedge: prepped intra-vaginal atrophic sensor with lubricant.   Carefully inserted sensor into vaginal canal, halting briefly at 75% insertion and then proceeding forward with full insertion.   sEMG assessment of PFM activity at rest and measured = 11-12 mV.   Patient then practiced cued single contractions with return to baseline for several reps using EMG graph for visualization. Increase rest time provided in between reps.   Patient began to experience some migratory abdominal pain after several reps of contractions, so it was halted. Ended session with intra-vaginal sensor in place for an additional 10 min for desensitizing purposes. Total time = 45 min.        Patient education: review of her current hip and low back stretching regimen; added happy baby stretch.   Emphasized the importance of being consistent with stretching to receive the maximum benefit. Patient verbalized understanding.     Rationale for treatment: decrease abdominal/pelvic tension and pain      WNL Improving Limited N/A   []  Increase Pelvic MM Strength []   []    []    [x]      []   Decrease Pelvic MM Hypertonus []     []    []    [x]      []   Decrease Incontinence Episodes []   []    []    [x]      []   Improve Voiding Habits []    []    []    [x]     []  Decrease Urgency []   []   []   [x]         OTHER OBJECTIVE/FUNCTIONAL MEASUREMENTS:   [x] baseline resting tone/EMG:  11-12    MUSCLE STRENGTH/PERF score:        ASSESSMENT:  Decent tolerance to insertion of vaginal treatment sensor, with no complaints of pain or burning.   There was inconsistent visualization of contractions on graph, but improved with cues to "stop gas from escaping."  Good return to baseline.     Patient reported having flatulence after completion of treatment, which may be the reason for the migratory abdominal pain she was experiencing during PFM exercises.         PLAN:  Continue with skilled pelvic floor PT plan of care, progressing as able or modifying when needed.

## 2023-09-25 ENCOUNTER — Encounter: Payer: PRIVATE HEALTH INSURANCE | Primary: Family Medicine

## 2023-10-02 DIAGNOSIS — M62838 Other muscle spasm: Principal | ICD-10-CM

## 2023-10-02 NOTE — Progress Notes (Signed)
Patient cancelled today's appointment - not feeling well.

## 2023-10-03 ENCOUNTER — Inpatient Hospital Stay: Payer: PRIVATE HEALTH INSURANCE | Primary: Family Medicine

## 2023-10-09 ENCOUNTER — Inpatient Hospital Stay: Admit: 2023-10-09 | Payer: PRIVATE HEALTH INSURANCE | Primary: Family Medicine

## 2023-10-09 NOTE — Progress Notes (Addendum)
 [address], [place], [place] [address]  [phone]    PHYSICAL THERAPY DAILY NOTE      Patient Name: Kim [name] DOB: 08/12/1995   Treatment/Medical Diagnosis: Pelvic pain, spasm    Referral Source: Len Handing, MD  Total session time: 55 min    Date of Initial Visit: 05/29/23 Attended Visits: 11  Total treatment time: 45 min        SUBJECTIVE/CURRENT STATUS:  I had to cancel two of my appointments because on one of the days I was having a lot of pain. And on the other appointment day I was on my menstrual cycle.   This last round of TP injections has not been as beneficial as the one(s) prior.  My next appointment for the injections will be in August.     Pain Level (0-10 scale): not rated currently      OBJECTIVE:    SUMMARY OF TREATMENT:      TODAY'S TREATMENT:   In supported hooklying with wedge: prepped intra-vaginal atrophic sensor with lubricant.   Carefully inserted sensor into vaginal canal, halting briefly at 75% insertion and then proceeding forward with full insertion.   sEMG assessment of PFM activity at rest and measured = 12-25 mV.   Initiated session with diaphragmatic breath work for tension reduction and to improve resting baseline. Patient then practiced cued single contractions with return to baseline for multiple reps, with clinician using EMG graph for visualization. Increase rest time provided in between reps.   Ended session with more diaphragmatic breathing x 10 min. During this time, discussion of natural pain relieving modalities available OTC. Amazon search revealed VagiKool re-usable frozen pads. Patient to do further research on this product.   Total time = 45 min.        Patient education: review of her current hip and low back stretching regimen; added happy baby stretch.   Emphasized the importance of being consistent with stretching to receive the maximum benefit. Patient verbalized understanding.     Rationale for treatment: decrease abdominal/pelvic tension  and pain      WNL Improving Limited N/A   []  Increase Pelvic MM Strength []   []    []    [x]      []   Decrease Pelvic MM Hypertonus []    []    []    [x]      []   Decrease Incontinence Episodes []   []    []    [x]      []   Improve Voiding Habits []    []    []    [x]     []  Decrease Urgency []   []   []   [x]         OTHER OBJECTIVE/FUNCTIONAL MEASUREMENTS:   [x] baseline resting tone/EMG:  12-25 mV initially, but did decrease to 10-19 mV.    MUSCLE STRENGTH/PERF score:        ASSESSMENT:  Decent tolerance to insertion of vaginal treatment sensor, with no complaints of pain/burning and no reflexive clenching or guarding.    More consistent visualization of contractions on graph today. Decided not to have patient view EMG screen during PFM exercises due to increasing anxiousness when attempting return to baseline.     VagiKool pads could be quite beneficial for urogenital pain relief. She was receptive and will do additional research on the product.       PLAN:  Continue with skilled pelvic floor PT plan of care, progressing as able or modifying when needed.
